# Patient Record
Sex: Female | Born: 1940 | Race: Asian | Hispanic: No | Marital: Married | Smoking: Never smoker
Health system: Southern US, Community
[De-identification: ages and names within clinical notes are randomized; demographics above are authoritative.]

## PROBLEM LIST (undated history)

## (undated) DIAGNOSIS — I10 Essential (primary) hypertension: Secondary | ICD-10-CM

## (undated) DIAGNOSIS — E119 Type 2 diabetes mellitus without complications: Secondary | ICD-10-CM

## (undated) DIAGNOSIS — E7800 Pure hypercholesterolemia, unspecified: Secondary | ICD-10-CM

## (undated) HISTORY — PX: FRACTURE SURGERY: SHX138

---

## 2000-08-07 ENCOUNTER — Encounter: Payer: Self-pay | Admitting: Emergency Medicine

## 2000-08-07 ENCOUNTER — Emergency Department (HOSPITAL_COMMUNITY): Admission: EM | Admit: 2000-08-07 | Discharge: 2000-08-07 | Payer: Self-pay | Admitting: Emergency Medicine

## 2000-09-03 ENCOUNTER — Encounter: Admission: RE | Admit: 2000-09-03 | Discharge: 2000-12-02 | Payer: Self-pay | Admitting: Internal Medicine

## 2000-10-30 ENCOUNTER — Encounter: Payer: Self-pay | Admitting: Internal Medicine

## 2000-10-30 ENCOUNTER — Encounter: Admission: RE | Admit: 2000-10-30 | Discharge: 2000-10-30 | Payer: Self-pay | Admitting: Internal Medicine

## 2000-11-30 ENCOUNTER — Encounter: Admission: RE | Admit: 2000-11-30 | Discharge: 2001-02-28 | Payer: Self-pay | Admitting: Internal Medicine

## 2000-12-03 ENCOUNTER — Encounter: Admission: RE | Admit: 2000-12-03 | Discharge: 2000-12-18 | Payer: Self-pay | Admitting: Internal Medicine

## 2000-12-28 ENCOUNTER — Ambulatory Visit (HOSPITAL_COMMUNITY): Admission: RE | Admit: 2000-12-28 | Discharge: 2000-12-28 | Payer: Self-pay | Admitting: *Deleted

## 2000-12-28 ENCOUNTER — Encounter: Payer: Self-pay | Admitting: *Deleted

## 2001-01-08 ENCOUNTER — Ambulatory Visit (HOSPITAL_COMMUNITY): Admission: RE | Admit: 2001-01-08 | Discharge: 2001-01-08 | Payer: Self-pay | Admitting: Orthopedic Surgery

## 2001-01-08 ENCOUNTER — Encounter: Payer: Self-pay | Admitting: Orthopedic Surgery

## 2001-01-14 ENCOUNTER — Encounter: Admission: RE | Admit: 2001-01-14 | Discharge: 2001-04-14 | Payer: Self-pay | Admitting: Orthopedic Surgery

## 2001-03-19 ENCOUNTER — Encounter: Admission: RE | Admit: 2001-03-19 | Discharge: 2001-04-27 | Payer: Self-pay | Admitting: Orthopedic Surgery

## 2001-05-05 ENCOUNTER — Ambulatory Visit (HOSPITAL_BASED_OUTPATIENT_CLINIC_OR_DEPARTMENT_OTHER): Admission: RE | Admit: 2001-05-05 | Discharge: 2001-05-06 | Payer: Self-pay | Admitting: Orthopedic Surgery

## 2001-05-19 ENCOUNTER — Encounter: Admission: RE | Admit: 2001-05-19 | Discharge: 2001-08-04 | Payer: Self-pay | Admitting: Orthopedic Surgery

## 2002-08-10 ENCOUNTER — Encounter: Payer: Self-pay | Admitting: Family Medicine

## 2002-08-10 ENCOUNTER — Ambulatory Visit (HOSPITAL_COMMUNITY): Admission: RE | Admit: 2002-08-10 | Discharge: 2002-08-10 | Payer: Self-pay | Admitting: Family Medicine

## 2003-11-15 ENCOUNTER — Other Ambulatory Visit: Admission: RE | Admit: 2003-11-15 | Discharge: 2003-11-15 | Payer: Self-pay | Admitting: Family Medicine

## 2003-12-30 ENCOUNTER — Ambulatory Visit (HOSPITAL_COMMUNITY): Admission: RE | Admit: 2003-12-30 | Discharge: 2003-12-30 | Payer: Self-pay | Admitting: Family Medicine

## 2004-05-31 ENCOUNTER — Emergency Department (HOSPITAL_COMMUNITY): Admission: EM | Admit: 2004-05-31 | Discharge: 2004-05-31 | Payer: Self-pay | Admitting: Emergency Medicine

## 2004-06-28 ENCOUNTER — Encounter: Admission: RE | Admit: 2004-06-28 | Discharge: 2004-09-26 | Payer: Self-pay | Admitting: Orthopedic Surgery

## 2004-08-29 ENCOUNTER — Emergency Department (HOSPITAL_COMMUNITY): Admission: EM | Admit: 2004-08-29 | Discharge: 2004-08-29 | Payer: Self-pay | Admitting: Emergency Medicine

## 2006-04-17 ENCOUNTER — Other Ambulatory Visit: Admission: RE | Admit: 2006-04-17 | Discharge: 2006-04-17 | Payer: Self-pay | Admitting: *Deleted

## 2006-04-28 ENCOUNTER — Ambulatory Visit (HOSPITAL_COMMUNITY): Admission: RE | Admit: 2006-04-28 | Discharge: 2006-04-28 | Payer: Self-pay | Admitting: Interventional Cardiology

## 2007-10-08 ENCOUNTER — Emergency Department (HOSPITAL_COMMUNITY): Admission: EM | Admit: 2007-10-08 | Discharge: 2007-10-08 | Payer: Self-pay | Admitting: Family Medicine

## 2007-11-15 ENCOUNTER — Ambulatory Visit: Payer: Self-pay | Admitting: *Deleted

## 2007-12-14 ENCOUNTER — Ambulatory Visit: Payer: Self-pay | Admitting: Family Medicine

## 2007-12-14 LAB — CONVERTED CEMR LAB: Microalb, Ur: 41.9 mg/dL — ABNORMAL HIGH (ref 0.00–1.89)

## 2008-01-11 ENCOUNTER — Emergency Department (HOSPITAL_COMMUNITY): Admission: EM | Admit: 2008-01-11 | Discharge: 2008-01-11 | Payer: Self-pay | Admitting: Emergency Medicine

## 2008-01-17 ENCOUNTER — Ambulatory Visit (HOSPITAL_COMMUNITY): Admission: RE | Admit: 2008-01-17 | Discharge: 2008-01-17 | Payer: Self-pay | Admitting: Family Medicine

## 2008-01-18 ENCOUNTER — Encounter (INDEPENDENT_AMBULATORY_CARE_PROVIDER_SITE_OTHER): Payer: Self-pay | Admitting: Family Medicine

## 2008-01-18 ENCOUNTER — Ambulatory Visit: Payer: Self-pay | Admitting: Internal Medicine

## 2008-01-18 LAB — CONVERTED CEMR LAB
ALT: 35 units/L (ref 0–35)
AST: 34 units/L (ref 0–37)
Albumin: 4.5 g/dL (ref 3.5–5.2)
Alkaline Phosphatase: 51 units/L (ref 39–117)
BUN: 10 mg/dL (ref 6–23)
Basophils Absolute: 0 10*3/uL (ref 0.0–0.1)
Basophils Relative: 1 % (ref 0–1)
CO2: 23 meq/L (ref 19–32)
Calcium: 9.6 mg/dL (ref 8.4–10.5)
Chloride: 105 meq/L (ref 96–112)
Cholesterol: 210 mg/dL — ABNORMAL HIGH (ref 0–200)
Creatinine, Ser: 0.6 mg/dL (ref 0.40–1.20)
Eosinophils Absolute: 0.1 10*3/uL (ref 0.0–0.7)
Eosinophils Relative: 2 % (ref 0–5)
Glucose, Bld: 94 mg/dL (ref 70–99)
HCT: 43.5 % (ref 36.0–46.0)
HDL: 59 mg/dL (ref >39)
Hemoglobin: 14.1 g/dL (ref 12.0–15.0)
LDL Cholesterol: 110 mg/dL — ABNORMAL HIGH (ref 0–99)
Lymphocytes Relative: 48 % — ABNORMAL HIGH (ref 12–46)
Lymphs Abs: 2.8 10*3/uL (ref 0.7–4.0)
MCHC: 32.4 g/dL (ref 30.0–36.0)
MCV: 92.2 fL (ref 78.0–100.0)
Monocytes Absolute: 0.5 10*3/uL (ref 0.1–1.0)
Monocytes Relative: 8 % (ref 3–12)
Neutro Abs: 2.5 10*3/uL (ref 1.7–7.7)
Neutrophils Relative %: 42 % — ABNORMAL LOW (ref 43–77)
Platelets: 267 10*3/uL (ref 150–400)
Potassium: 4.1 meq/L (ref 3.5–5.3)
RBC: 4.72 M/uL (ref 3.87–5.11)
RDW: 12.6 % (ref 11.5–15.5)
Sodium: 140 meq/L (ref 135–145)
TSH: 1.67 microintl units/mL (ref 0.350–4.50)
Total Bilirubin: 0.8 mg/dL (ref 0.3–1.2)
Total CHOL/HDL Ratio: 3.6
Total Protein: 7.4 g/dL (ref 6.0–8.3)
Triglycerides: 204 mg/dL — ABNORMAL HIGH (ref <150)
VLDL: 41 mg/dL — ABNORMAL HIGH (ref 0–40)
Vit D, 1,25-Dihydroxy: 19 — ABNORMAL LOW (ref 30–89)
WBC: 5.9 10*3/uL (ref 4.0–10.5)

## 2008-02-14 ENCOUNTER — Emergency Department (HOSPITAL_COMMUNITY): Admission: EM | Admit: 2008-02-14 | Discharge: 2008-02-14 | Payer: Self-pay | Admitting: Emergency Medicine

## 2008-02-14 ENCOUNTER — Ambulatory Visit: Payer: Self-pay | Admitting: Internal Medicine

## 2008-06-08 ENCOUNTER — Encounter (INDEPENDENT_AMBULATORY_CARE_PROVIDER_SITE_OTHER): Payer: Self-pay | Admitting: Family Medicine

## 2008-06-08 ENCOUNTER — Ambulatory Visit: Payer: Self-pay | Admitting: Family Medicine

## 2008-06-16 ENCOUNTER — Ambulatory Visit (HOSPITAL_COMMUNITY): Admission: RE | Admit: 2008-06-16 | Discharge: 2008-06-16 | Payer: Self-pay | Admitting: Family Medicine

## 2008-07-31 ENCOUNTER — Emergency Department (HOSPITAL_COMMUNITY): Admission: EM | Admit: 2008-07-31 | Discharge: 2008-08-01 | Payer: Self-pay | Admitting: Emergency Medicine

## 2008-08-09 ENCOUNTER — Encounter: Admission: RE | Admit: 2008-08-09 | Discharge: 2008-08-09 | Payer: Self-pay | Admitting: Family Medicine

## 2009-02-21 ENCOUNTER — Other Ambulatory Visit: Admission: RE | Admit: 2009-02-21 | Discharge: 2009-02-21 | Payer: Self-pay | Admitting: Family Medicine

## 2009-02-21 ENCOUNTER — Encounter: Admission: RE | Admit: 2009-02-21 | Discharge: 2009-02-21 | Payer: Self-pay | Admitting: Family Medicine

## 2009-07-22 IMAGING — CR DG CHEST 2V
2 series · 2 of 2 positions shown · non-contrast
Comparison: 05/31/2004

CLINICAL DATA: Neck and back pain

CHEST - 2 VIEW

[w chest pa]
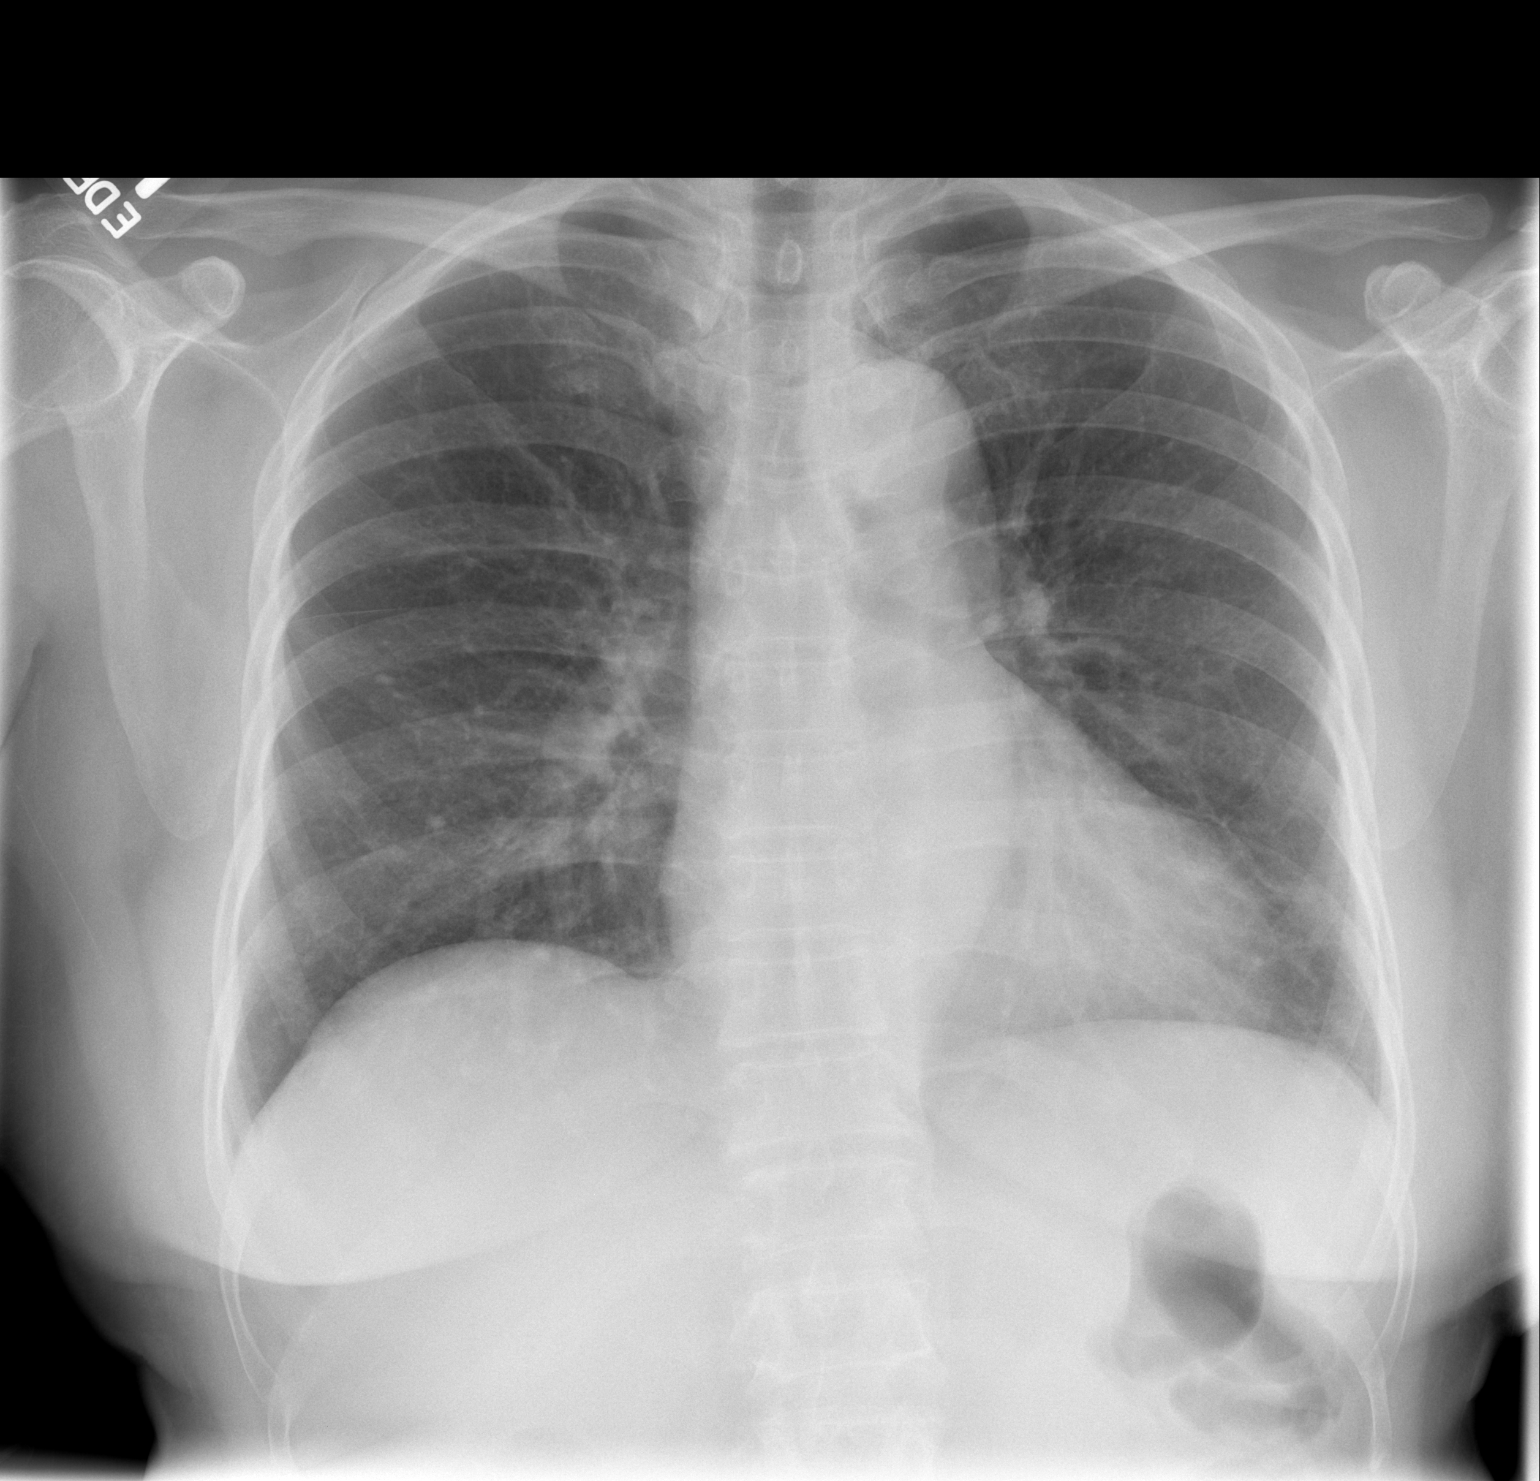

[w chest lat]
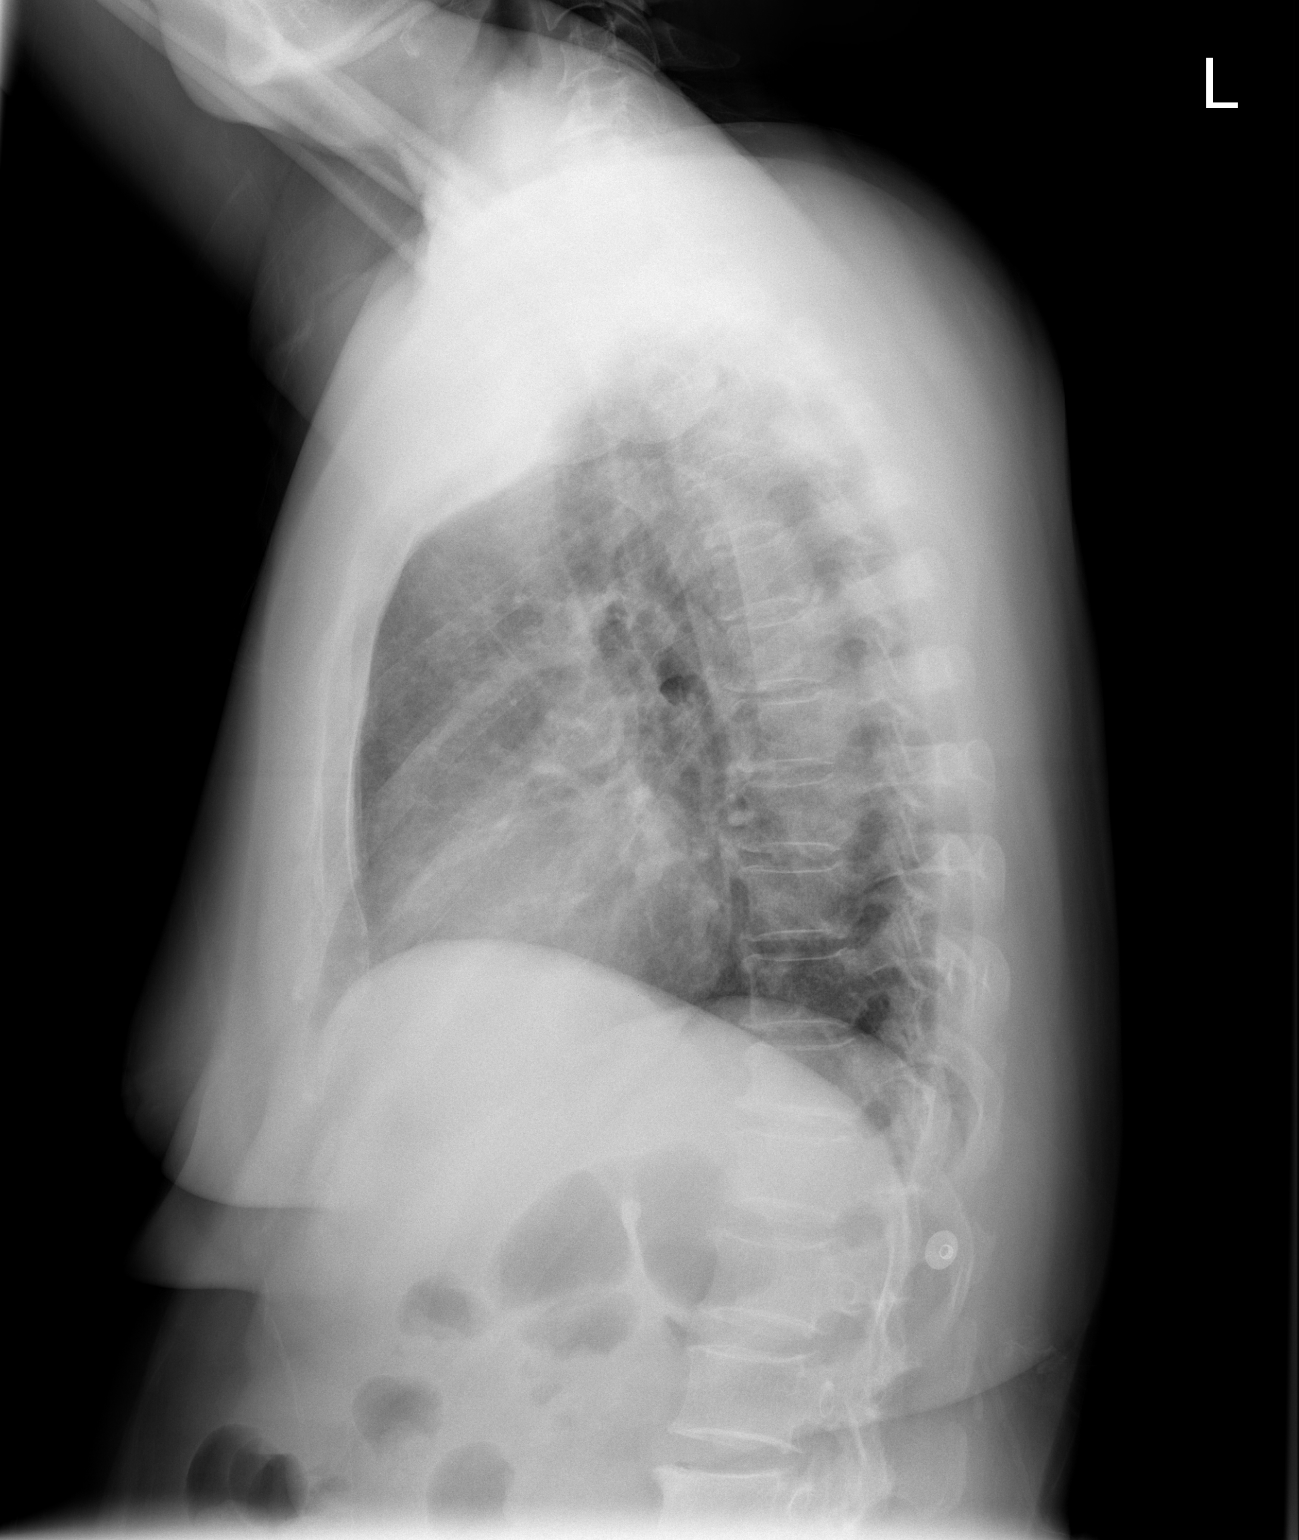

[2 of 2 positions shown; findings below may reference images not displayed]

FINDINGS: The cardiac silhouette, mediastinal and hilar contours
are within normal limits. There are chronic lung changes but no
acute  pulmonary findings.  No pleural effusions.  The bony thorax
is intact with stable mild compression deformity of T11.
IMPRESSION: 1.  Chronic lung changes.  No acute pulmonary findings.

## 2010-07-02 LAB — DIFFERENTIAL
Basophils Absolute: 0 10*3/uL (ref 0.0–0.1)
Basophils Relative: 0 % (ref 0–1)
Eosinophils Absolute: 0.1 10*3/uL (ref 0.0–0.7)
Eosinophils Relative: 1 % (ref 0–5)
Lymphocytes Relative: 29 % (ref 12–46)
Lymphs Abs: 2.5 10*3/uL (ref 0.7–4.0)
Monocytes Absolute: 1.1 10*3/uL — ABNORMAL HIGH (ref 0.1–1.0)
Monocytes Relative: 13 % — ABNORMAL HIGH (ref 3–12)
Neutro Abs: 4.9 10*3/uL (ref 1.7–7.7)
Neutrophils Relative %: 57 % (ref 43–77)

## 2010-07-02 LAB — BASIC METABOLIC PANEL
BUN: 13 mg/dL (ref 6–23)
CO2: 25 mEq/L (ref 19–32)
Calcium: 8.8 mg/dL (ref 8.4–10.5)
Chloride: 105 mEq/L (ref 96–112)
Creatinine, Ser: 0.7 mg/dL (ref 0.4–1.2)
GFR calc Af Amer: >60 mL/min (ref >60)
GFR calc non Af Amer: >60 mL/min (ref >60)
Glucose, Bld: 139 mg/dL — ABNORMAL HIGH (ref 70–99)
Potassium: 3.6 mEq/L (ref 3.5–5.1)
Sodium: 136 mEq/L (ref 135–145)

## 2010-07-02 LAB — CBC
HCT: 37 % (ref 36.0–46.0)
Hemoglobin: 12.9 g/dL (ref 12.0–15.0)
MCHC: 34.7 g/dL (ref 30.0–36.0)
MCV: 92.2 fL (ref 78.0–100.0)
Platelets: 217 10*3/uL (ref 150–400)
RBC: 4.01 MIL/uL (ref 3.87–5.11)
RDW: 12.4 % (ref 11.5–15.5)
WBC: 8.6 10*3/uL (ref 4.0–10.5)

## 2010-07-02 LAB — URINALYSIS, ROUTINE W REFLEX MICROSCOPIC
Bilirubin Urine: NEGATIVE
Glucose, UA: NEGATIVE mg/dL
Hgb urine dipstick: NEGATIVE
Protein, ur: NEGATIVE mg/dL
Urobilinogen, UA: 0.2 mg/dL (ref 0.0–1.0)

## 2010-08-09 NOTE — Op Note (Signed)
[place]  Patient:    Alexis Kaiser, Alexis Kaiser Visit Number: [id number] MRN: [id number]          Service Type: DSU Location: [facility] Attending Physician:  Georgena Spurling Dictated by:   Georgena Spurling, M.D. Proc. Date: 05/05/01 Admit Date:  05/05/2001                             Operative Report  SURGEON:  Georgena Spurling, M.D.  ASSISTANT:  Jamelle Rushing, P.A.  ANESTHESIA:  General plus interscalene block.  INDICATIONS FOR PROCEDURE:  The patient is 3 months status post injury and has failed conservative treatment.  MRI evidence of rotator cuff strain and slap tear.  Informed consent was obtained.  DESCRIPTION OF PROCEDURE:  The patient was laid supine and administered general anesthesia and placed in a beach chair position.  The left shoulder was prepped and draped in the usual sterile fashion.  Posterior portal was created with a #1 blade and 1 trocar and cannula.  It showed a frayed tear of the superior and anterior labrum.  The biceps was well anchored but at the 11 oclock position it had been avulsed away from the glenoid rim.  We made an anterior portal with an outside in technique and using an 11 blade, blunt trocar and cannula we then placed a clear plastic cannula in place and used a 3-5 Gator shaver to debride back the radial tears of the labrum.  We then denuded the bone in the area of the slap lesion and then used the arthroscopic bioabsorbable cork screw as suture, drilled and placed the suture at the 11 oclock position, then used a suture passing device to come from the outside of the labrum in, piercing through, grabbing one arm of the stitch and tying it down simply with a sliding stitch and a locking half hitch.  Then irrigated the shoulder and went into the subacromial space from posterior, created a straight lateral portal, formed a bursectomy and anterolateral acromioplasty with a 4.0 mm bur and released the CA ligament with a 90  degree Arthrocare wand.  Once the decompression was complete we evacuated the subacromial space with fluid and instruments, closed all 3 portals with interrupted 4-0 nylon sutures, dressed with Adaptic, 4 x 4s, ABDs, and sterile soaked taped.  Placed the shoulder in a sling and swath dressing.  The patient tolerated the procedure well.  COMPLICATIONS:  None.  DRAINS:  None.  ESTIMATED BLOOD LOSS:  Minimal. Dictated by:   Georgena Spurling, M.D. Attending Physician:  Georgena Spurling DD:  05/05/01 TD:  05/05/01 Job: [id number]

## 2011-07-28 ENCOUNTER — Other Ambulatory Visit: Payer: Self-pay | Admitting: Rehabilitation

## 2011-07-28 DIAGNOSIS — M545 Low back pain: Secondary | ICD-10-CM

## 2011-07-29 ENCOUNTER — Ambulatory Visit
Admission: RE | Admit: 2011-07-29 | Discharge: 2011-07-29 | Disposition: A | Discharge status: Home / Self Care | Payer: Medicare Other | Source: Ambulatory Visit | Attending: Rehabilitation | Admitting: Rehabilitation

## 2011-07-29 DIAGNOSIS — M545 Low back pain: Secondary | ICD-10-CM

## 2012-06-17 ENCOUNTER — Encounter (HOSPITAL_COMMUNITY): Payer: Self-pay | Admitting: Family Medicine

## 2012-06-17 ENCOUNTER — Inpatient Hospital Stay (HOSPITAL_COMMUNITY)
Admission: EM | Admit: 2012-06-17 | Discharge: 2012-06-19 | DRG: 287 | Disposition: A | Discharge status: Home / Self Care | Payer: Medicare Other | Attending: Internal Medicine | Admitting: Internal Medicine

## 2012-06-17 ENCOUNTER — Emergency Department (HOSPITAL_COMMUNITY): Payer: Medicare Other

## 2012-06-17 DIAGNOSIS — I10 Essential (primary) hypertension: Secondary | ICD-10-CM | POA: Diagnosis present

## 2012-06-17 DIAGNOSIS — E785 Hyperlipidemia, unspecified: Secondary | ICD-10-CM | POA: Diagnosis present

## 2012-06-17 DIAGNOSIS — R0789 Other chest pain: Principal | ICD-10-CM | POA: Diagnosis present

## 2012-06-17 DIAGNOSIS — Z9010 Acquired absence of unspecified breast and nipple: Secondary | ICD-10-CM

## 2012-06-17 DIAGNOSIS — Z853 Personal history of malignant neoplasm of breast: Secondary | ICD-10-CM

## 2012-06-17 DIAGNOSIS — Z8249 Family history of ischemic heart disease and other diseases of the circulatory system: Secondary | ICD-10-CM

## 2012-06-17 DIAGNOSIS — K219 Gastro-esophageal reflux disease without esophagitis: Secondary | ICD-10-CM | POA: Diagnosis present

## 2012-06-17 DIAGNOSIS — Z7982 Long term (current) use of aspirin: Secondary | ICD-10-CM

## 2012-06-17 DIAGNOSIS — R079 Chest pain, unspecified: Secondary | ICD-10-CM | POA: Diagnosis present

## 2012-06-17 DIAGNOSIS — E119 Type 2 diabetes mellitus without complications: Secondary | ICD-10-CM | POA: Diagnosis present

## 2012-06-17 DIAGNOSIS — I200 Unstable angina: Secondary | ICD-10-CM

## 2012-06-17 DIAGNOSIS — Z923 Personal history of irradiation: Secondary | ICD-10-CM

## 2012-06-17 DIAGNOSIS — Z9221 Personal history of antineoplastic chemotherapy: Secondary | ICD-10-CM

## 2012-06-17 DIAGNOSIS — Z79899 Other long term (current) drug therapy: Secondary | ICD-10-CM

## 2012-06-17 DIAGNOSIS — E7800 Pure hypercholesterolemia, unspecified: Secondary | ICD-10-CM | POA: Diagnosis present

## 2012-06-17 DIAGNOSIS — Z833 Family history of diabetes mellitus: Secondary | ICD-10-CM

## 2012-06-17 HISTORY — DX: Essential (primary) hypertension: I10

## 2012-06-17 HISTORY — DX: Pure hypercholesterolemia, unspecified: E78.00

## 2012-06-17 HISTORY — DX: Type 2 diabetes mellitus without complications: E11.9

## 2012-06-17 LAB — BASIC METABOLIC PANEL
BUN: 17 mg/dL (ref 6–23)
CO2: 27 mEq/L (ref 19–32)
Calcium: 10.1 mg/dL (ref 8.4–10.5)
Creatinine, Ser: 0.76 mg/dL (ref 0.50–1.10)
GFR calc non Af Amer: 84 mL/min — ABNORMAL LOW (ref >90)
Glucose, Bld: 94 mg/dL (ref 70–99)
Sodium: 138 mEq/L (ref 135–145)

## 2012-06-17 LAB — CBC WITH DIFFERENTIAL/PLATELET
Eosinophils Absolute: 0.2 10*3/uL (ref 0.0–0.7)
Eosinophils Relative: 2 % (ref 0–5)
HCT: 37.6 % (ref 36.0–46.0)
Lymphs Abs: 3.1 10*3/uL (ref 0.7–4.0)
MCH: 31 pg (ref 26.0–34.0)
MCV: 90.4 fL (ref 78.0–100.0)
Monocytes Absolute: 0.7 10*3/uL (ref 0.1–1.0)
Platelets: 209 10*3/uL (ref 150–400)
RBC: 4.16 MIL/uL (ref 3.87–5.11)
RDW: 12.7 % (ref 11.5–15.5)

## 2012-06-17 LAB — PROTIME-INR
INR: 0.99 (ref 0.00–1.49)
Prothrombin Time: 13 seconds (ref 11.6–15.2)

## 2012-06-17 LAB — URINALYSIS, ROUTINE W REFLEX MICROSCOPIC
Bilirubin Urine: NEGATIVE
Glucose, UA: NEGATIVE mg/dL
Hgb urine dipstick: NEGATIVE
Ketones, ur: NEGATIVE mg/dL
Protein, ur: NEGATIVE mg/dL
Urobilinogen, UA: 0.2 mg/dL (ref 0.0–1.0)

## 2012-06-17 LAB — MRSA PCR SCREENING: MRSA by PCR: NEGATIVE

## 2012-06-17 LAB — GLUCOSE, CAPILLARY: Glucose-Capillary: 129 mg/dL — ABNORMAL HIGH (ref 70–99)

## 2012-06-17 LAB — URINE MICROSCOPIC-ADD ON

## 2012-06-17 MED ORDER — ASPIRIN EC 81 MG PO TBEC
81.0000 mg | DELAYED_RELEASE_TABLET | Freq: Every day | ORAL | Status: DC
  Administered 2012-06-18: 81 mg via ORAL
  Filled 2012-06-17 (×2): qty 1

## 2012-06-17 MED ORDER — ACETAMINOPHEN 325 MG PO TABS
650.0000 mg | ORAL_TABLET | Freq: Four times a day (QID) | ORAL | Status: DC | PRN
  Administered 2012-06-18: 650 mg via ORAL
  Filled 2012-06-17: qty 2

## 2012-06-17 MED ORDER — ONDANSETRON HCL 4 MG PO TABS: 4.0000 mg | ORAL_TABLET | Freq: Four times a day (QID) | ORAL | Status: DC | PRN

## 2012-06-17 MED ORDER — SIMVASTATIN 40 MG PO TABS
40.0000 mg | ORAL_TABLET | Freq: Every evening | ORAL | Status: DC
  Filled 2012-06-17 (×4): qty 1

## 2012-06-17 MED ORDER — PANTOPRAZOLE SODIUM 40 MG PO TBEC
40.0000 mg | DELAYED_RELEASE_TABLET | Freq: Every day | ORAL | Status: DC
  Administered 2012-06-18 – 2012-06-19 (×2): 40 mg via ORAL
  Filled 2012-06-17 (×2): qty 1

## 2012-06-17 MED ORDER — NITROGLYCERIN IN D5W 200-5 MCG/ML-% IV SOLN
5.0000 ug/min | INTRAVENOUS | Status: DC
  Administered 2012-06-17 – 2012-06-18 (×2): 5 ug/min via INTRAVENOUS
  Filled 2012-06-17: qty 250

## 2012-06-17 MED ORDER — ONDANSETRON HCL 4 MG/2ML IJ SOLN: 4.0000 mg | Freq: Three times a day (TID) | INTRAMUSCULAR | Status: DC | PRN

## 2012-06-17 MED ORDER — SODIUM CHLORIDE 0.9 % IV SOLN
INTRAVENOUS | Status: DC
  Administered 2012-06-17: 20:00:00 via INTRAVENOUS

## 2012-06-17 MED ORDER — SODIUM CHLORIDE 0.9 % IV SOLN: 250.0000 mL | INTRAVENOUS | Status: DC | PRN

## 2012-06-17 MED ORDER — METOPROLOL TARTRATE 12.5 MG HALF TABLET
12.5000 mg | ORAL_TABLET | Freq: Two times a day (BID) | ORAL | Status: DC
  Administered 2012-06-17 – 2012-06-18 (×2): 12.5 mg via ORAL
  Filled 2012-06-17 (×6): qty 1

## 2012-06-17 MED ORDER — ACETAMINOPHEN 650 MG RE SUPP: 650.0000 mg | Freq: Four times a day (QID) | RECTAL | Status: DC | PRN

## 2012-06-17 MED ORDER — HEPARIN (PORCINE) IN NACL 100-0.45 UNIT/ML-% IJ SOLN
1100.0000 [IU]/h | INTRAMUSCULAR | Status: DC
  Administered 2012-06-17: 800 [IU]/h via INTRAVENOUS
  Filled 2012-06-17 (×2): qty 250

## 2012-06-17 MED ORDER — SODIUM CHLORIDE 0.9 % IJ SOLN: 3.0000 mL | INTRAMUSCULAR | Status: DC | PRN

## 2012-06-17 MED ORDER — ONDANSETRON HCL 4 MG/2ML IJ SOLN
4.0000 mg | Freq: Four times a day (QID) | INTRAMUSCULAR | Status: DC | PRN
  Administered 2012-06-18: 4 mg via INTRAVENOUS
  Filled 2012-06-17: qty 2

## 2012-06-17 MED ORDER — IRBESARTAN 75 MG PO TABS
75.0000 mg | ORAL_TABLET | Freq: Every day | ORAL | Status: DC
  Administered 2012-06-18: 75 mg via ORAL
  Filled 2012-06-17 (×3): qty 1

## 2012-06-17 MED ORDER — ASPIRIN 81 MG PO CHEW
324.0000 mg | CHEWABLE_TABLET | ORAL | Status: AC
  Administered 2012-06-18: 324 mg via ORAL
  Filled 2012-06-17: qty 4

## 2012-06-17 MED ORDER — MORPHINE SULFATE 4 MG/ML IJ SOLN
4.0000 mg | Freq: Once | INTRAMUSCULAR | Status: AC
  Administered 2012-06-17: 4 mg via INTRAVENOUS
  Filled 2012-06-17: qty 1

## 2012-06-17 MED ORDER — HEPARIN BOLUS VIA INFUSION
3500.0000 [IU] | Freq: Once | INTRAVENOUS | Status: AC
  Administered 2012-06-17: 3500 [IU] via INTRAVENOUS

## 2012-06-17 MED ORDER — SODIUM CHLORIDE 0.9 % IJ SOLN
3.0000 mL | Freq: Two times a day (BID) | INTRAMUSCULAR | Status: DC
  Administered 2012-06-17 – 2012-06-18 (×2): 3 mL via INTRAVENOUS

## 2012-06-17 MED ORDER — NITROGLYCERIN 0.4 MG SL SUBL
0.4000 mg | SUBLINGUAL_TABLET | SUBLINGUAL | Status: DC | PRN
  Administered 2012-06-17: 0.4 mg via SUBLINGUAL

## 2012-06-17 MED ORDER — GABAPENTIN 300 MG PO CAPS
600.0000 mg | ORAL_CAPSULE | Freq: Every day | ORAL | Status: DC
  Administered 2012-06-18: 22:00:00 600 mg via ORAL
  Filled 2012-06-17 (×4): qty 2

## 2012-06-17 MED ORDER — MORPHINE SULFATE 2 MG/ML IJ SOLN
1.0000 mg | INTRAMUSCULAR | Status: DC | PRN
  Administered 2012-06-18 (×2): 1 mg via INTRAVENOUS
  Filled 2012-06-17 (×2): qty 1

## 2012-06-17 MED ORDER — INSULIN ASPART 100 UNIT/ML ~~LOC~~ SOLN: 0.0000 [IU] | Freq: Three times a day (TID) | SUBCUTANEOUS | Status: DC

## 2012-06-17 MED ORDER — SODIUM CHLORIDE 0.9 % IV SOLN
1.0000 mL/kg/h | INTRAVENOUS | Status: DC
  Administered 2012-06-18: 1 mL/kg/h via INTRAVENOUS

## 2012-06-17 NOTE — Consult Note (Signed)
Reason for Consult: Chest pain Referring Physician:   [name] is an 72 y.o. female.  HPI:   The patient is a 73 yo female who does not speak English but is accompanied by her daughter.  She has a history of DM, HTN and HLD.  She developed chest pressure at 2200hrs last night which she described as severe.  It was associated with nausea and tingling in her arms.  She denies vomiting, fever, LEE, ABD pain, hematuria, hematochezia.  The meds given in the ER have helped.    Past Medical History  Diagnosis Date  . Hypertension   . Diabetes mellitus without complication   . High cholesterol     History reviewed. No pertinent past surgical history.  History reviewed. No pertinent family history.  Social History:  reports that she has never smoked. She does not have any smokeless tobacco history on file. She reports that she does not drink alcohol. Her drug history is not on file.  Allergies: No Known Allergies  Medications:  Prior to Admission medications   Medication Sig Start Date End Date Taking? Authorizing Provider  aspirin 81 MG tablet Take 81 mg by mouth daily.   Yes Historical Provider, MD  Cholecalciferol 4000 UNITS CAPS Take 4,000 Units by mouth daily.   Yes Historical Provider, MD  gabapentin (NEURONTIN) 300 MG capsule Take 300-600 mg by mouth daily.   Yes Historical Provider, MD  metFORMIN (GLUMETZA) 500 MG (MOD) 24 hr tablet Take 500 mg by mouth 2 (two) times daily with a meal.   Yes Historical Provider, MD  simvastatin (ZOCOR) 40 MG tablet Take 40 mg by mouth every evening.   Yes Historical Provider, MD  valsartan (DIOVAN) 160 MG tablet Take 160 mg by mouth daily.   Yes Historical Provider, MD     Results for orders placed during the hospital encounter of 06/17/12 (from the past 48 hour(s))  CBC WITH DIFFERENTIAL     Status: None   Collection Time    06/17/12  2:50 PM      Result Value Range   WBC 7.5  4.0 - 10.5 K/uL   RBC 4.16  3.87 - 5.11 MIL/uL   Hemoglobin  12.9  12.0 - 15.0 g/dL   HCT 04.5  40.9 - 81.1 %   MCV 90.4  78.0 - 100.0 fL   MCH 31.0  26.0 - 34.0 pg   MCHC 34.3  30.0 - 36.0 g/dL   RDW 91.4  78.2 - 95.6 %   Platelets 209  150 - 400 K/uL   Neutrophils Relative 48  43 - 77 %   Neutro Abs 3.6  1.7 - 7.7 K/uL   Lymphocytes Relative 41  12 - 46 %   Lymphs Abs 3.1  0.7 - 4.0 K/uL   Monocytes Relative 9  3 - 12 %   Monocytes Absolute 0.7  0.1 - 1.0 K/uL   Eosinophils Relative 2  0 - 5 %   Eosinophils Absolute 0.2  0.0 - 0.7 K/uL   Basophils Relative 1  0 - 1 %   Basophils Absolute 0.0  0.0 - 0.1 K/uL  BASIC METABOLIC PANEL     Status: Abnormal   Collection Time    06/17/12  2:50 PM      Result Value Range   Sodium 138  135 - 145 mEq/L   Potassium 4.3  3.5 - 5.1 mEq/L   Chloride 102  96 - 112 mEq/L   CO2 27  19 - 32 mEq/L   Glucose, Bld 94  70 - 99 mg/dL   BUN 17  6 - 23 mg/dL   Creatinine, Ser 0.45  0.50 - 1.10 mg/dL   Calcium 40.9  8.4 - 81.1 mg/dL   GFR calc non Af Amer 84 (*) >90 mL/min   GFR calc Af Amer >90  >90 mL/min   Comment:            The eGFR has been calculated     using the CKD EPI equation.     This calculation has not been     validated in all clinical     situations.     eGFR's persistently     <90 mL/min signify     possible Chronic Kidney Disease.  POCT I-STAT TROPONIN I     Status: None   Collection Time    06/17/12  3:03 PM      Result Value Range   Troponin i, poc 0.01  0.00 - 0.08 ng/mL   Comment 3            Comment: Due to the release kinetics of cTnI,     a negative result within the first hours     of the onset of symptoms does not rule out     myocardial infarction with certainty.     If myocardial infarction is still suspected,     repeat the test at appropriate intervals.    Dg Chest Port 1 View  06/17/2012  *RADIOLOGY REPORT*  Clinical Data: Chest pain.  PORTABLE CHEST - 1 VIEW  Comparison: PA and lateral chest 02/14/2008.  Findings: Lungs are clear.  Heart size normal.  No  pneumothorax or pleural effusion.  IMPRESSION: Negative chest.   Original Report Authenticated By: Holley Dexter, M.D.     Review of Systems  Constitutional: Negative for fever and diaphoresis.  Respiratory: Positive for shortness of breath. Negative for cough.   Cardiovascular: Positive for chest pain. Negative for leg swelling.  Gastrointestinal: Positive for nausea. Negative for vomiting, abdominal pain and blood in stool.  Genitourinary: Negative for dysuria and hematuria.  Neurological: Positive for tingling (She reports tingling in her arms).   Blood pressure 112/72, pulse 61, temperature 98.3 F (36.8 C), temperature source Oral, resp. rate 18, height 4\' 11"  (1.499 m), weight 58.968 kg (130 lb), SpO2 96.00%. Physical Exam  Constitutional: She is oriented to person, place, and time. She appears well-developed. No distress.  Overweight  HENT:  Head: Normocephalic and atraumatic.  Mouth/Throat: Oropharynx is clear and moist. No oropharyngeal exudate.  Eyes: EOM are normal. Pupils are equal, round, and reactive to light. No scleral icterus.  Neck: Normal range of motion. No JVD present.  Cardiovascular: Normal rate, regular rhythm, S1 normal and S2 normal.   No murmur heard. Pulses:      Radial pulses are 2+ on the right side, and 2+ on the left side.       Dorsalis pedis pulses are 2+ on the right side, and 2+ on the left side.  No Carotid Bruits  Respiratory: Effort normal and breath sounds normal. No respiratory distress. She has no wheezes. She has no rales.  GI: Soft. Bowel sounds are normal. She exhibits no distension. There is no tenderness.  Musculoskeletal: She exhibits no edema.  Lymphadenopathy:    She has no cervical adenopathy.  Neurological: She is alert and oriented to person, place, and time. She exhibits normal muscle tone.  Skin: Skin is  warm and dry.  Psychiatric: She has a normal mood and affect.    Assessment/Plan: Principal Problem:   Chest  pain Active Problems:   HTN (hypertension)   HLD (hyperlipidemia)   Diabetes mellitus type 2 in nonobese   GERD (gastroesophageal reflux disease)  Plan:  The patient's presentation is concerning for unstable angina.  EKG shows 1st degree AVB but no acute changes.  Continue IV heparin and IV nitro.  Titrate as needed.  Will add lopressor 12.5mg  BID.  Check lipids in AM.  TSH and A1C.  Coronary angiogram may be the appropriate choice.     Alexis Kaiser, Alexis Kaiser 06/17/2012, 5:22 PM    Patient seen and examined. Agree with assessment and plan. Very pleasant 72 yo [place] female with history of DM, HTN, hyperlipidemia who presents after experiencing several episodes of substernal chest pressure.Last evening, she had her most severe episode associated with diaphoresis, nausea and dyspnea. Her pain has improved with NTG and heparin. ECG with subtle T changes. Symptom complex is worrisome for unstable angina. Continue with heparin, NTG add beta-blocker and plan definitive cardiac catheterization tomorrow. Discussed with daughter who is with patient who understands risks/benefits with potential PCI if indicated.   Alexis Bihari, MD, [facility] 06/17/2012 5:40 PM

## 2012-06-17 NOTE — Progress Notes (Signed)
ANTICOAGULATION CONSULT NOTE - Follow Up Consult  Pharmacy Consult for heparin Indication: chest pain/ACS  No Known Allergies  Patient Measurements: Height: 5\' 2"  (157.5 cm) Weight: 126 lb 12.2 oz (57.5 kg) IBW/kg (Calculated) : 50.1 Heparin Dosing Weight: 59  Vital Signs: Temp: 97.7 F (36.5 C) (03/27 2339) Temp src: Oral (03/27 2339) BP: 109/63 mmHg (03/27 2339) Pulse Rate: 74 (03/27 2339)  Labs:  Recent Labs  06/17/12 1450 06/17/12 2016 06/17/12 2236  HGB 12.9  --   --   HCT 37.6  --   --   PLT 209  --   --   LABPROT  --   --  13.0  INR  --   --  0.99  HEPARINUNFRC  --   --  0.17*  CREATININE 0.76  --   --   TROPONINI  --  <0.30  --     Estimated Creatinine Clearance: 52.5 ml/min (by C-G formula based on Cr of 0.76).   Medical History: Past Medical History  Diagnosis Date  . Hypertension   . Diabetes mellitus without complication   . High cholesterol     Medications:  Prescriptions prior to admission  Medication Sig Dispense Refill  . aspirin 81 MG tablet Take 81 mg by mouth daily.      . Cholecalciferol 4000 UNITS CAPS Take 4,000 Units by mouth daily.      Marland Kitchen gabapentin (NEURONTIN) 300 MG capsule Take 600 mg by mouth at bedtime.      . metFORMIN (GLUMETZA) 500 MG (MOD) 24 hr tablet Take 500 mg by mouth 2 (two) times daily with a meal.      . simvastatin (ZOCOR) 40 MG tablet Take 40 mg by mouth every evening.      . valsartan (DIOVAN) 160 MG tablet Take 160 mg by mouth daily.      Marland Kitchen gabapentin (NEURONTIN) 300 MG capsule Take 300-600 mg by mouth daily.       Scheduled:  . [START ON 06/18/2012] aspirin  324 mg Oral Pre-Cath  . [START ON 06/18/2012] aspirin EC  81 mg Oral Daily  . gabapentin  600 mg Oral QHS  . [COMPLETED] heparin  3,500 Units Intravenous Once  . [START ON 06/18/2012] insulin aspart  0-9 Units Subcutaneous TID WC  . irbesartan  75 mg Oral Daily  . metoprolol tartrate  12.5 mg Oral BID  . [COMPLETED]  morphine injection  4 mg Intravenous  Once  . [START ON 06/18/2012] pantoprazole  40 mg Oral Q breakfast  . simvastatin  40 mg Oral QPM  . sodium chloride  3 mL Intravenous Q12H    Assessment: 72 yo who presented with [place] at the outpt visit. She was tx to ED to r/o MI. Initial heparin level 0.17 units/ml  Goal of Therapy:  Heparin level 0.3-0.7 units/ml Monitor platelets by anticoagulation protocol: Yes   Plan:  Increase heparin drip to 950 units/hr Check heparin level in 6 hours  Shameeka Silliman [place] 06/17/2012,11:58 PM

## 2012-06-17 NOTE — Progress Notes (Signed)
ANTICOAGULATION CONSULT NOTE - Initial Consult  Pharmacy Consult for heparin Indication: chest pain/ACS  No Known Allergies  Patient Measurements: Height: 4\' 11"  (149.9 cm) Weight: 130 lb (58.968 kg) IBW/kg (Calculated) : 43.2 Heparin Dosing Weight: 59  Vital Signs: Temp: 98.3 F (36.8 C) (03/27 1329) Temp src: Oral (03/27 1329) BP: 107/70 mmHg (03/27 1445) Pulse Rate: 61 (03/27 1445)  Labs: No results found for this basename: HGB, HCT, PLT, APTT, LABPROT, INR, HEPARINUNFRC, CREATININE, CKTOTAL, CKMB, TROPONINI,  in the last 72 hours  Estimated Creatinine Clearance: 51.9 ml/min (by C-G formula based on Cr of 0.7).   Medical History: Past Medical History  Diagnosis Date  . Hypertension   . Diabetes mellitus without complication   . High cholesterol     Medications:   (Not in a hospital admission) Scheduled:  .  morphine injection  4 mg Intravenous Once    Assessment: 72 yo who presented with [place] at the outpt visit. She was tx to ED to r/o MI. IV heparin has been ordered for anticoag.  Goal of Therapy:  Heparin level 0.3-0.7 units/ml Monitor platelets by anticoagulation protocol: Yes   Plan:   Heparin bolus 3500 units/hr Heparin drip at 800 units/hr F/u with 6hr heparin level  Ulyses Southward [place] 06/17/2012,3:18 PM

## 2012-06-17 NOTE — ED Notes (Signed)
Pt sent here from [facility] for unstable angina. sts chest heaviness that started last night. Relief with nitro.324 ASA given at office. Chest pain associated with SOB, nausea, vomiting and fatigue.

## 2012-06-17 NOTE — H&P (Signed)
[facility] History and Physical  Alexis CAVENAUGH WUJ:[id number] DOB: 1940-04-20 DOA: 06/17/2012  Referring physician: Shea Stakes PCP: Dr. Merri Brunette  Chief Complaint: Chest pain.  HPI: Alexis Kaiser is a 71 y.o. female with a past medical history significant for hypertension, diabetes, hyperlipidemia, neuropathy and gastroesophageal reflux disease; came to the hospital secondary to chest pain. Patient only speaks [place] and translation during this encounter was provided by her daughter who speak good [place]. They reports chest pain localize on the left side, no radiation, pressure-like, associated shortness of breath, nausea, and sensation that she almost passed out due to the pain. They reports that her pain started just today night and at that moment patient saw that the pain will go away and decline medical attention after about an hour patient subsided and she was able to go to bed at the moment that she will called this morning the discomfort for her left-sided chest returns again. She endorses that she was not doing any x-ray showed when the pain presented. In the ED a chest x-ray demonstrated no acute or pulmonary process. EKG shows some flattening T waves changes otherwise no acute ischemic abnormalities; troponins negative x1. The rest of her blood work was unremarkable. Patient's pain was only relieved by the use of nitroglycerin. She was started on heparin drip and nitroglycerin drip do to her ongoing discomfort and [facility] has been called to admit the patient for further evaluation and treatment along with cardiology consultation.   Review of Systems:  Negative except as otherwise mentioned on history of present illness.  Past Medical History  Diagnosis Date  . Hypertension   . Diabetes mellitus without complication   . High cholesterol    History reviewed. No pertinent past surgical history.  Social History:  reports that she has never smoked. She does  not have any smokeless tobacco history on file. She reports that she does not drink alcohol. Her drug history is not on file. Lives at home and did not require any assistance with activities of daily living. No Known Allergies  Family history: Significant for hypertension and diabetes otherwise irrelevant according to patient.  Prior to Admission medications   Medication Sig Start Date End Date Taking? Authorizing Provider  aspirin 81 MG tablet Take 81 mg by mouth daily.   Yes Historical Provider, MD  Cholecalciferol 4000 UNITS CAPS Take 4,000 Units by mouth daily.   Yes Historical Provider, MD  gabapentin (NEURONTIN) 300 MG capsule Take 300-600 mg by mouth daily.   Yes Historical Provider, MD  metFORMIN (GLUMETZA) 500 MG (MOD) 24 hr tablet Take 500 mg by mouth 2 (two) times daily with a meal.   Yes Historical Provider, MD  simvastatin (ZOCOR) 40 MG tablet Take 40 mg by mouth every evening.   Yes Historical Provider, MD  valsartan (DIOVAN) 160 MG tablet Take 160 mg by mouth daily.   Yes Historical Provider, MD   Physical Exam: Filed Vitals:   06/17/12 1445 06/17/12 1500 06/17/12 1530 06/17/12 1609  BP: 107/70  120/77 112/72  Pulse: 61  61 61  Temp:      TempSrc:      Resp: 15  17 18   Height:  4\' 11"  (1.499 m)    Weight:  58.968 kg (130 lb)    SpO2: 99%  100% 96%     General:  Mild distress secondary to ongoing chest discomfort. Able to speak in full sentences in not overt shortness of breath appreciated on exam  Eyes:  PERRLA, extraocular muscles intact, no icterus, no nystagmus  ENT:  Moist mucous membranes, good dentition, no erythema or exudates inside her mouth. No discharges out of her ears or nostrils.  Neck: Supple, no thyromegaly, no bruits, no JVD  Cardiovascular: S1 and S2 appreciated, sinus rhythm, regular rate, no murmurs, no gallops or rubs.  Respiratory: Clear to auscultation bilaterally.  Abdomen: Soft, nontender, nondistended, positive bowel sounds  Skin: No  rash, no petechiae  Musculoskeletal: Full range of motion. No joint swelling or erythema appreciated.  Psychiatric: Stable and appropriate for situation.  Neurologic: Alert, awake and oriented x3, no focal motor or sensory deficit appreciated on exam, cranial nerves grossly intact, able to follow commands appropriately and answering  Questions through interpreter.  Labs on Admission:  Basic Metabolic Panel:  Recent Labs Lab 06/17/12 1450  NA 138  K 4.3  CL 102  CO2 27  GLUCOSE 94  BUN 17  CREATININE 0.76  CALCIUM 10.1   CBC:  Recent Labs Lab 06/17/12 1450  WBC 7.5  NEUTROABS 3.6  HGB 12.9  HCT 37.6  MCV 90.4  PLT 209   Radiological Exams on Admission: Dg Chest Port 1 View  06/17/2012  *RADIOLOGY REPORT*  Clinical Data: Chest pain.  PORTABLE CHEST - 1 VIEW  Comparison: PA and lateral chest 02/14/2008.  Findings: Lungs are clear.  Heart size normal.  No pneumothorax or pleural effusion.  IMPRESSION: Negative chest.   Original Report Authenticated By: Holley Dexter, M.D.     EKG:  Rate: 66  Rhythm: normal sinus rhythm  QRS Axis: normal  Intervals: normal  ST/T Wave abnormalities: normal  Conduction Disutrbances:none  Narrative Interpretation:  Old EKG Reviewed: changes noted - T WAVE FLATTENING   Assessment/Plan 1-Chest pain: Typical presentation. Significant risk factors including: Hypertension, diabetes mellitus, age, hyperlipidemia. Pain only relieve by nitroglycerin. Also with some flattening T wave changes; no other acute ischemic changes or old EKG to compare. -Will admit to a step down in order to continue heparin drip and also nitroglycerin drip -Continue aspirin, statins, ARB -Will check 2-D echo, cycle cardiac enzymes and EKGs. -Cardiology has been consulted and will follow recommendations. -started on PPI  2-HTN (hypertension): Will continue antihypertensive regimen Avapro (75 mg by mouth daily. Patient also receiving nitroglycerin drip we will  keep her blood pressure under control  3-HLD (hyperlipidemia): Continue statins. Check lipid panel  4-Diabetes mellitus type 2 in nonobese: Appears to be well control according to family members. They also endorses good compliance. Will check hemoglobin A1c. Will hold metformin while inpatient and will use a sliding scale insulin.  5-GERD (gastroesophageal reflux disease): Started on PPI.  DVT: On heparin drip.    Cardiology has been consulted.  Code Status: Full Family Communication: daughter at bedside Disposition Plan: admitted to step down inpatient for further evaluation and treatment due to CP/ACS; LOS > 2 midnights  Time spent: >30 minutes  Dreyah Montrose [facility] Pager [phone]  If 7PM-7AM, please contact night-coverage [email or web address] Password [facility] 06/17/2012, 4:54 PM

## 2012-06-17 NOTE — ED Provider Notes (Signed)
History     CSN: [id number]  Arrival date & time 06/17/12  1316   First MD Initiated Contact with Patient 06/17/12 1340      Chief Complaint  Patient presents with  . Chest Pain    (Consider location/radiation/quality/duration/timing/severity/associated sxs/prior treatment) HPI Comments: Pt is a 72 y/o female who presents with chief complaint of left sided weakness. She states that she woke up yesterday with severe left sided weakness and a sense of heaviness and numbness in the left side of her entire body. The weakness and numbness have persisted until now. Pt says that she has had TIA's in the past when she underwent intracranial shunt placement for an aneurysm. Says that this occurred many years ago and has not had any strokes until now. She underwent treatment for breast cancer last year, including bilateral mastectomy, chemo, and radiation. Pt denies dizziness, nausea, vomiting, loss of consciousness, headache. Her sister is present and denies any seizure like activity.  PATIENT SPEAKS [place], DAUGHTER TRANSLATED.  Patient is a 72 y.o. female presenting with chest pain. The history is provided by the patient.  Chest Pain Associated symptoms: nausea   Associated symptoms: no abdominal pain, no back pain, no cough, no palpitations, no shortness of breath and not vomiting     Past Medical History  Diagnosis Date  . Hypertension   . Diabetes mellitus without complication   . High cholesterol     History reviewed. No pertinent past surgical history.  History reviewed. No pertinent family history.  History  Substance Use Topics  . Smoking status: Never Smoker   . Smokeless tobacco: Not on file  . Alcohol Use: No    OB History   Grav Para Term Preterm Abortions TAB SAB Ect Mult Living                  Review of Systems  Respiratory: Negative for cough, shortness of breath and wheezing.   Cardiovascular: Positive for chest pain. Negative for palpitations.   Gastrointestinal: Positive for nausea. Negative for vomiting, abdominal pain, diarrhea, constipation, blood in stool and abdominal distention.  Genitourinary: Negative for hematuria and difficulty urinating.  Musculoskeletal: Negative for back pain.  Skin: Negative for color change.  Hematological: Does not bruise/bleed easily.  Psychiatric/Behavioral: Negative for confusion.    Allergies  Review of patient's allergies indicates no known allergies.  Home Medications   Current Outpatient Rx  Name  Route  Sig  Dispense  Refill  . aspirin 81 MG tablet   Oral   Take 81 mg by mouth daily.         . Cholecalciferol 4000 UNITS CAPS   Oral   Take 4,000 Units by mouth daily.         Marland Kitchen gabapentin (NEURONTIN) 300 MG capsule   Oral   Take 300-600 mg by mouth daily.         . metFORMIN (GLUMETZA) 500 MG (MOD) 24 hr tablet   Oral   Take 500 mg by mouth 2 (two) times daily with a meal.         . simvastatin (ZOCOR) 40 MG tablet   Oral   Take 40 mg by mouth every evening.         . valsartan (DIOVAN) 160 MG tablet   Oral   Take 160 mg by mouth daily.           BP 107/70  Pulse 61  Temp(Src) 98.3 F (36.8 C) (Oral)  Resp 15  SpO2 99%  Physical Exam  Nursing note and vitals reviewed. Constitutional: She is oriented to person, place, and time. She appears well-developed and well-nourished.  HENT:  Head: Normocephalic and atraumatic.  Eyes: EOM are normal. Pupils are equal, round, and reactive to light.  Neck: Neck supple.  Cardiovascular: Normal rate, regular rhythm and normal heart sounds.   No murmur heard. Pulmonary/Chest: Effort normal. No respiratory distress.  Abdominal: Soft. She exhibits no distension. There is no tenderness. There is no rebound and no guarding.  Neurological: She is alert and oriented to person, place, and time.  Skin: Skin is warm and dry.    ED Course  Procedures (including critical care time)  Labs Reviewed  CBC WITH  DIFFERENTIAL  BASIC METABOLIC PANEL  URINALYSIS, ROUTINE W REFLEX MICROSCOPIC   Dg Chest Port 1 View  06/17/2012  *RADIOLOGY REPORT*  Clinical Data: Chest pain.  PORTABLE CHEST - 1 VIEW  Comparison: PA and lateral chest 02/14/2008.  Findings: Lungs are clear.  Heart size normal.  No pneumothorax or pleural effusion.  IMPRESSION: Negative chest.   Original Report Authenticated By: Holley Dexter, M.D.      No diagnosis found.    MDM   Date: 06/17/2012  Rate: 66  Rhythm: normal sinus rhythm  QRS Axis: normal  Intervals: normal  ST/T Wave abnormalities: normal  Conduction Disutrbances:none  Narrative Interpretation:   Old EKG Reviewed: changes noted - T WAVE FLATTENING  Differential diagnosis includes: ACS syndrome CHF exacerbation Valvular disorder Myocarditis Pericarditis Pericardial effusion Pneumonia Pleural effusion Pulmonary edema PE Anemia Musculoskeletal pain  Pt with hx of HTN comes in with cc of left sided chest pain, pressure like. The pain is pretty typical, responded to nitro per EMS and nitro in the ED as well. EKG shows some t wave flattening, but no acute changes. Will get 1 more ekg to check for dynamic studies. Pt to get heparin drip initiated- she already got a full dose of ASA by EMS. We will get Cards consult.   CRITICAL CARE Performed by: Derwood Kaplan   Total critical care time: 30 minutes  Critical care time was exclusive of separately billable procedures and treating other patients.  Critical care was necessary to treat or prevent imminent or life-threatening deterioration.  Critical care was time spent personally by me on the following activities: development of treatment plan with patient and/or surrogate as well as nursing, discussions with consultants, evaluation of patient's response to treatment, examination of patient, obtaining history from patient or surrogate, ordering and performing treatments and interventions, ordering and  review of laboratory studies, ordering and review of radiographic studies, pulse oximetry and re-evaluation of patient's condition.   5:13 PM [place] Cards consulted - and they will f/u. Medicine admission. Nitro and heparin gtt started.   Derwood Kaplan, MD 06/17/12 [phone]

## 2012-06-18 ENCOUNTER — Encounter (HOSPITAL_COMMUNITY)
Admission: EM | Disposition: A | Discharge status: Home / Self Care | Payer: Self-pay | Source: Home / Self Care | Attending: Internal Medicine

## 2012-06-18 HISTORY — PX: LEFT HEART CATHETERIZATION WITH CORONARY ANGIOGRAM: SHX5451

## 2012-06-18 LAB — HEPATIC FUNCTION PANEL
Albumin: 3.5 g/dL (ref 3.5–5.2)
Alkaline Phosphatase: 48 U/L (ref 39–117)
Bilirubin, Direct: <0.1 mg/dL (ref 0.0–0.3)
Total Bilirubin: 0.3 mg/dL (ref 0.3–1.2)

## 2012-06-18 LAB — GLUCOSE, CAPILLARY
Glucose-Capillary: 87 mg/dL (ref 70–99)
Glucose-Capillary: 86 mg/dL (ref 70–99)
Glucose-Capillary: 135 mg/dL — ABNORMAL HIGH (ref 70–99)

## 2012-06-18 LAB — CBC
MCH: 30.5 pg (ref 26.0–34.0)
Platelets: 207 10*3/uL (ref 150–400)
RBC: 4.06 MIL/uL (ref 3.87–5.11)
WBC: 7.5 10*3/uL (ref 4.0–10.5)

## 2012-06-18 LAB — LIPID PANEL: Total CHOL/HDL Ratio: 2.8 RATIO

## 2012-06-18 LAB — BASIC METABOLIC PANEL
BUN: 19 mg/dL (ref 6–23)
CO2: 26 mEq/L (ref 19–32)
Chloride: 105 mEq/L (ref 96–112)
GFR calc Af Amer: 84 mL/min — ABNORMAL LOW (ref >90)
Potassium: 4.1 mEq/L (ref 3.5–5.1)

## 2012-06-18 LAB — HEPARIN LEVEL (UNFRACTIONATED): Heparin Unfractionated: 0.22 IU/mL — ABNORMAL LOW (ref 0.30–0.70)

## 2012-06-18 LAB — HEMOGLOBIN A1C
Hgb A1c MFr Bld: 6.4 % — ABNORMAL HIGH (ref <5.7)
Mean Plasma Glucose: 137 mg/dL — ABNORMAL HIGH (ref <117)

## 2012-06-18 LAB — TSH: TSH: 5.69 u[IU]/mL — ABNORMAL HIGH (ref 0.350–4.500)

## 2012-06-18 SURGERY — LEFT HEART CATHETERIZATION WITH CORONARY ANGIOGRAM
Anesthesia: LOCAL

## 2012-06-18 MED ORDER — SODIUM CHLORIDE 0.9 % IV SOLN: 1.0000 mL/kg/h | INTRAVENOUS | Status: AC

## 2012-06-18 MED ORDER — SODIUM CHLORIDE 0.9 % IV SOLN: 250.0000 mL | INTRAVENOUS | Status: DC | PRN

## 2012-06-18 MED ORDER — MIDAZOLAM HCL 2 MG/2ML IJ SOLN
INTRAMUSCULAR | Status: AC
  Filled 2012-06-18: qty 2

## 2012-06-18 MED ORDER — HEPARIN (PORCINE) IN NACL 2-0.9 UNIT/ML-% IJ SOLN
INTRAMUSCULAR | Status: AC
  Filled 2012-06-18: qty 1000

## 2012-06-18 MED ORDER — SODIUM CHLORIDE 0.9 % IJ SOLN: 3.0000 mL | Freq: Two times a day (BID) | INTRAMUSCULAR | Status: DC

## 2012-06-18 MED ORDER — HEPARIN SODIUM (PORCINE) 1000 UNIT/ML IJ SOLN
INTRAMUSCULAR | Status: AC
  Filled 2012-06-18: qty 1

## 2012-06-18 MED ORDER — SODIUM CHLORIDE 0.9 % IJ SOLN: 3.0000 mL | INTRAMUSCULAR | Status: DC | PRN

## 2012-06-18 MED ORDER — VERAPAMIL HCL 2.5 MG/ML IV SOLN
INTRAVENOUS | Status: AC
  Filled 2012-06-18: qty 4

## 2012-06-18 MED ORDER — FENTANYL CITRATE 0.05 MG/ML IJ SOLN
INTRAMUSCULAR | Status: AC
  Filled 2012-06-18: qty 2

## 2012-06-18 MED ORDER — LIDOCAINE HCL (PF) 1 % IJ SOLN
INTRAMUSCULAR | Status: AC
  Filled 2012-06-18: qty 30

## 2012-06-18 NOTE — Progress Notes (Signed)
I have seen and evaluated the patient this AM along with Nada Boozer, NP. I agree with her findings, examination as well as impression recommendations.  Per initial evaluation yesterday with Dr.Kelly's concern for CP being related to ACS, we will plan for LHC/PCI   She has had some intermittent "pressure" in her L upper chesp o/n, but biomarkers are negative.  ECG abnormalities are subtle non-specific changes.  We had a [place] Language interpreter present & Boyce Medici, PA explained the R/B/B/I of procedure & consent was obtained.  For future interpretation, we will need to use her family members, since herr dialect is different from the interpreter.   Marykay Lex, M.D., M.S. [facility] [address] [place], [place]  [address]  [phone] Pager # [phone] 06/18/2012 10:40 AM

## 2012-06-18 NOTE — Progress Notes (Signed)
[facility] PROGRESS NOTE  Alexis Kaiser ZOX:[id number] DOB: Jul 24, 1940 DOA: 06/17/2012 PCP: No primary provider on file.  Assessment/Plan: 1. Chest pain - assumed initially to be unstable angina. Patient was kept on telemetry, in stepdown on heparin and nitro drips. cath 06/18/12 was negative for critical CAD 2. GERD c/w PPI   HPI/Subjective: Awaiting cath - patient seen at 3 PM   Objective: Filed Vitals:   06/18/12 1602 06/18/12 1700 06/18/12 1828 06/18/12 1900  BP:    124/80  Pulse: 61   60  Temp:  96.4 F (35.8 C) 96.4 F (35.8 C)   TempSrc:  Oral Oral   Resp:      Height:      Weight:      SpO2:    99%    Intake/Output Summary (Last 24 hours) at 06/18/12 1959 Last data filed at 06/18/12 1100  Gross per 24 hour  Intake 1012.5 ml  Output      0 ml  Net 1012.5 ml   Filed Weights   06/17/12 1500 06/17/12 2000  Weight: 58.968 kg (130 lb) 57.5 kg (126 lb 12.2 oz)    Exam:   General:  axox3  Cardiovascular: rrr  Respiratory: ctab   Abdomen: soft, nt   Musculoskeletal: intact    Data Reviewed: Basic Metabolic Panel:  Recent Labs Lab 06/17/12 1450 06/17/12 2236 06/18/12 0650  NA 138  --  141  K 4.3  --  4.1  CL 102  --  105  CO2 27  --  26  GLUCOSE 94  --  92  BUN 17  --  19  CREATININE 0.76  --  0.81  CALCIUM 10.1  --  9.4  MG  --  1.8  --    Liver Function Tests:  Recent Labs Lab 06/17/12 2236  AST 39*  ALT 36*  ALKPHOS 48  BILITOT 0.3  PROT 6.9  ALBUMIN 3.5   No results found for this basename: LIPASE, AMYLASE,  in the last 168 hours No results found for this basename: AMMONIA,  in the last 168 hours CBC:  Recent Labs Lab 06/17/12 1450 06/18/12 0650  WBC 7.5 7.5  NEUTROABS 3.6  --   HGB 12.9 12.4  HCT 37.6 36.6  MCV 90.4 90.1  PLT 209 207   Cardiac Enzymes:  Recent Labs Lab 06/17/12 2016 06/18/12 0004 06/18/12 0650  TROPONINI <0.30 <0.30 <0.30   BNP (last 3 results) No results found for this basename:  PROBNP,  in the last 8760 hours CBG:  Recent Labs Lab 06/17/12 2209 06/18/12 0746 06/18/12 1208 06/18/12 1707  GLUCAP 129* 98 87 86    Recent Results (from the past 240 hour(s))  MRSA PCR SCREENING     Status: None   Collection Time    06/17/12  8:17 PM      Result Value Range Status   MRSA by PCR NEGATIVE  NEGATIVE Final   Comment:            The GeneXpert MRSA Assay (FDA     approved for NASAL specimens     only), is one component of a     comprehensive MRSA colonization     surveillance program. It is not     intended to diagnose MRSA     infection nor to guide or     monitor treatment for     MRSA infections.     Studies: Dg Chest Port 1 View  06/17/2012  *RADIOLOGY REPORT*  Clinical Data: Chest pain.  PORTABLE CHEST - 1 VIEW  Comparison: PA and lateral chest 02/14/2008.  Findings: Lungs are clear.  Heart size normal.  No pneumothorax or pleural effusion.  IMPRESSION: Negative chest.   Original Report Authenticated By: Holley Dexter, M.D.     Scheduled Meds: . aspirin EC  81 mg Oral Daily  . gabapentin  600 mg Oral QHS  . insulin aspart  0-9 Units Subcutaneous TID WC  . irbesartan  75 mg Oral Daily  . metoprolol tartrate  12.5 mg Oral BID  . pantoprazole  40 mg Oral Q breakfast  . simvastatin  40 mg Oral QPM  . [START ON 06/19/2012] sodium chloride  3 mL Intravenous Q12H   Continuous Infusions: . sodium chloride      Principal Problem:   Chest pain Active Problems:   HTN (hypertension)   HLD (hyperlipidemia)   Diabetes mellitus type 2 in nonobese   GERD (gastroesophageal reflux disease)      Alexis Kaiser  [facility] Pager [phone]. If 7PM-7AM, please contact night-coverage at [email or web address], password [facility] 06/18/2012, 7:59 PM  LOS: 1 day

## 2012-06-18 NOTE — Progress Notes (Signed)
ANTICOAGULATION CONSULT NOTE - Follow Up Consult  Pharmacy Consult for heparin Indication: chest pain/ACS  Labs:  Recent Labs  06/17/12 1450 06/17/12 2016 06/17/12 2236 06/18/12 0004 06/18/12 0650  HGB 12.9  --   --   --   --   HCT 37.6  --   --   --   --   PLT 209  --   --   --   --   LABPROT  --   --  13.0  --   --   INR  --   --  0.99  --   --   HEPARINUNFRC  --   --  0.17*  --  0.22*  CREATININE 0.76  --   --   --   --   TROPONINI  --  <0.30  --  <0.30  --     Assessment: 72yo female remains subtherapeutic on heparin after rate increase, plan for cardiac cath this pm.  Goal of Therapy:  Heparin level 0.3-0.7 units/ml   Plan:  Will increase heparin gtt by another 2-3 units/kg/hr to 1100 units/hr and f/u after cath.  Vernard Gambles, PharmD, BCPS  06/18/2012,7:43 AM

## 2012-06-18 NOTE — Care Management Note (Signed)
    Page 1 of 1   06/18/2012     9:00:57 AM   CARE MANAGEMENT NOTE 06/18/2012  Patient:  Alexis Kaiser, Alexis Kaiser   Account Number:  [id number]  Date Initiated:  06/18/2012  Documentation initiated by:  Junius Creamer  Subjective/Objective Assessment:   adm w ch pain     Action/Plan:   lives w husband   Anticipated DC Date:     Anticipated DC Plan:        DC Planning Services  CM consult      Choice offered to / List presented to:             Status of service:   Medicare Important Message given?   (If response is "NO", the following Medicare IM given date fields will be blank) Date Medicare IM given:   Date Additional Medicare IM given:    Discharge Disposition:    Per UR Regulation:  Reviewed for med. necessity/level of care/duration of stay  If discussed at Long Length of Stay Meetings, dates discussed:    Comments:  3/28 0900 debbie Nancy Manuele rn,bsn

## 2012-06-18 NOTE — Progress Notes (Signed)
Subjective: Chest pain persists, she is also complaining of headache  Objective: Vital signs in last 24 hours: Temp:  [97.7 F (36.5 C)-98.3 F (36.8 C)] 97.7 F (36.5 C) (03/28 0743) Pulse Rate:  [56-74] 56 (03/28 0743) Resp:  [8-20] 17 (03/28 0743) BP: (88-132)/(58-80) 112/77 mmHg (03/28 0600) SpO2:  [93 %-100 %] 97 % (03/28 0743) Weight:  [57.5 kg (126 lb 12.2 oz)-58.968 kg (130 lb)] 57.5 kg (126 lb 12.2 oz) (03/27 2000) Weight change:    Intake/Output from previous day: +656 03/27 0701 - 03/28 0700 In: 726.5 [I.V.:726.5] Out: -  Intake/Output this shift:    PE: General: alert and oriented, her daughter is currently translating for me Heart: S1S2 RRR, no murmur, SB to 55 Lungs:clear without rales, rhonchi or wheezes Abd:+ BS, soft, non tender, no masses palpated Ext: no edema, 2+ pedal pulses, bil.    Lab Results:  Recent Labs  06/17/12 1450 06/18/12 0650  WBC 7.5 7.5  HGB 12.9 12.4  HCT 37.6 36.6  PLT 209 207   BMET  Recent Labs  06/17/12 1450 06/18/12 0650  NA 138 141  K 4.3 4.1  CL 102 105  CO2 27 26  GLUCOSE 94 92  BUN 17 19  CREATININE 0.76 0.81  CALCIUM 10.1 9.4    Recent Labs  06/18/12 0004 06/18/12 0650  TROPONINI <0.30 <0.30    Lab Results  Component Value Date   CHOL 146 06/18/2012   HDL 53 06/18/2012   LDLCALC 60 06/18/2012   TRIG 165* 06/18/2012   CHOLHDL 2.8 06/18/2012   No results found for this basename: HGBA1C     Lab Results  Component Value Date   TSH 1.670 01/18/2008    Hepatic Function Panel  Recent Labs  06/17/12 2236  PROT 6.9  ALBUMIN 3.5  AST 39*  ALT 36*  ALKPHOS 48  BILITOT 0.3  BILIDIR <0.1  IBILI NOT CALCULATED    Recent Labs  06/18/12 0650  CHOL 146     Studies/Results: Dg Chest Port 1 View  06/17/2012  *RADIOLOGY REPORT*  Clinical Data: Chest pain.  PORTABLE CHEST - 1 VIEW  Comparison: PA and lateral chest 02/14/2008.  Findings: Lungs are clear.  Heart size normal.  No pneumothorax or  pleural effusion.  IMPRESSION: Negative chest.   Original Report Authenticated By: Holley Dexter, M.D.     Medications: I have reviewed the patient's current medications. Scheduled Meds: . aspirin EC  81 mg Oral Daily  . gabapentin  600 mg Oral QHS  . insulin aspart  0-9 Units Subcutaneous TID WC  . irbesartan  75 mg Oral Daily  . metoprolol tartrate  12.5 mg Oral BID  . pantoprazole  40 mg Oral Q breakfast  . simvastatin  40 mg Oral QPM  . sodium chloride  3 mL Intravenous Q12H   Continuous Infusions: . sodium chloride 50 mL/hr at 06/17/12 2019  . sodium chloride 1 mL/kg/hr (06/18/12 0431)  . heparin 1,100 Units/hr (06/18/12 0742)  . nitroGLYCERIN 5 mcg/min (06/18/12 0039)   PRN Meds:.sodium chloride, acetaminophen, acetaminophen, morphine injection, ondansetron (ZOFRAN) IV, ondansetron, sodium chloride  Assessment/Plan: Principal Problem:   Chest pain Active Problems:   HTN (hypertension)   HLD (hyperlipidemia)   Diabetes mellitus type 2 in nonobese   GERD (gastroesophageal reflux disease)  PLAN: Negative MI, plan for cardiac cath today.  EKG SB at 56, first degree AV block.  Non specific inf./lat ST changes.  She does not speak [place].  Have asked nursing  staff to arrange translator for cardiac cath. Cath lab is working on this.  Pt with headache will not increase NTG but will give morphine and zofran now for nausea.   LOS: 1 day   Time spent with pt. : minutes. Glora Hulgan R 06/18/2012, 8:41 AM

## 2012-06-18 NOTE — Progress Notes (Signed)
  Echocardiogram 2D Echocardiogram has been performed.  Ellender Hose A 06/18/2012, 9:46 AM

## 2012-06-18 NOTE — CV Procedure (Signed)
[facility]     CARDIAC CATHETERIZATION REPORT  Alexis Kaiser   MRN: [id number] Jan 08, 1941   ADMIT DATE:  06/17/2012  Performing Cardiologist: Alexis Kaiser Primary Physician: No primary provider on file. Primary Cardiologist:  Alexis Bihari., MD  Procedures Performed:  Left Heart Catheterization with Native Coronary Angiography via 5 Fr Right Radial Artery access  Left Ventriculography, (RAO) 10 ml/sec for 30 ml total contrast  Indication(s): Chest Pain concerning for Unstable Angina  History: 72 y.o. female who does not speak English but is accompanied by her daughter. She has a history of DM, HTN and HLD. She developed chest pressure at 2200hrs last night which she described as severe. It was associated with nausea and tingling in her arms.  She was seen in consultation by Dr. Nicki Kaiser, who felt that her symptoms are very concerning for unstable angina. She was placed on IV nitroglycerin glycerin and heparin. She is now referred for diagnostic cardiac catheterization.  Consent: This procedure has been fully reviewed with the patient and written informed consent has been obtained  With the use of a [place] language interpreter.  Consent for signed by MD and patient with RN witness -- placed on chart.  Procedure: The patient was brought to the 2nd Floor [facility] in the fasting state and prepped and draped in the usual sterile fashion for (Right radial access. A modified Allen's test with plethysmography was performed demonstrating excellent ulnar artery collateral the right wrist. Sterile technique was used including antiseptics, cap, gloves, gown, hand hygiene, mask and sheet.  Skin prep: Chlorhexidine;  Time Out: Verified patient identification, verified procedure, site/side was marked, verified correct patient position, special equipment/implants available, medications/allergies/relevent history reviewed, required  imaging and test results available.  Performed  The right wrist was anesthetized with 1% subcutaneous Lidocaine.  The right radial artery was accessed using the Seldinger Technique with placement of a 5 Fr Glide Sheath. The sheath was aspirated and flushed.  Then a total of 10 ml of standard Radial Artery Cocktail (see medications) was infused.    A 5 Fr TIG Catheter was advanced of over a Versicore wire into the ascending Aorta.  The catheter was used to engage the Left and Right Coronary Arteries.  Multiple cineangiographic views of the both coronary artery system(s) were performed.   This catheter was then exchanged over the Long Exchange Safety J wire for an angled Pigtail catheter that was advanced across the Aortic Valve.  LV hemodynamics were measured, and Left Ventriculography was performed.  LV hemodynamics were then re-sampled, and the catheter was pulled back across the Aortic Valve for measurement of "pull-back" gradient.  The catheter was removed completely out of the body over the safety-J wire.  The sheath was removed in the Cath Lab with a TR band placed at 11 ml Air at 1645 hrs (time).  Reverse Allen's test revealed non-occlusive hemostasis.  The patient was transported to the PACU area in chest pain free, hemodynamically stable condition.   The patient  was stable before, during and following the procedure.   Patient did tolerate procedure well. There were not complications.  EBL: Less than 5 mL  Medications:  Premedication: 5 mg  Valium  Sedation:  1 mg IV Versed, 25 IV mcg Fentanyl  Contrast:  66 mL Omnipaque  3000 Units IV Heparin  Radial Cocktail: 5 mg Verapamil, 400 mcg NTG, 2 ml 2% Lidocaine in 10 ml NS  Hemodynamics:  Central  Aortic Pressure / Mean Aortic Pressure: 116/63 mmHg; 84 mmHg   LV Pressure / LV End diastolic Pressure:  115/8 mmHg; 18 mmHg  Left Ventriculography:  EF:  65%  Wall Motion: Normal  Coronary Angiographic Data: Lateral seated heart.  Left  Main:  Large-caliber, upward takeoff vessel that trifurcates into LAD, Ramus Intermedius and Circumflex  Left Anterior Descending (LAD):  Large-caliber vessel the very proximal moderate caliber diagonal branch followed by a large septal trunk. There is then a smaller second diagonal branch. The distal LAD is extremely tortuous and tapers and rate small vess el as it reaches the apex. There is an additional septal trunk that takes off just prior to the D2. Angiographically normal coronaries with minimal luminal irregularities.   Circumflex (LCx):  Moderate caliber vessel which essentially gives off 2 small OM branches but terminates as a very tortuous tapering lateral OM that courses along the inferolateral wall. Minimal irregularities.   Ramus Intermedius:  Moderate large-caliber very tortuous vessel which courses in a high OM distribution almost to the inferoapex.  Right Coronary Artery: Very large caliber dominant vessel with one major RV marginal branch. The vessel bifurcates distally into a very large extensive RPDA which reaches almost to the inferior apex and the right posterior AV groove branch which gives off one small posterior lateral branch followed femoral bifurcation into 2 smaller moderate caliber tortuous RPL branches and the AV nodal artery. Minimal luminal irregularities.  Impression:  Angiographically normal coronary arteries with no significant lesions to explain chest pain.  Normal left jugular function, hyperdynamic ventricle with upper normal to mildly elevated LVEDP.  Nonanginal chest pain.  Plan:  The patient will be transferred to 6500 post catheterization unit for radial cath care.  She will likely be ready for discharge later on this evening or tomorrow morning. I have notified Dr. Lavera Kaiser from [facility].  The case and results was discussed with the patient and daughter who served as an interpreter  The case and results was discussed with the patient's  Cardiologist.  Time Spend Directly with Patient:  35 minutes  Alexis Kaiser, M.D., M.S. [facility] [address] [place], [place]  [address]  [phone]  06/18/2012 5:03 PM

## 2012-06-19 LAB — GLUCOSE, CAPILLARY: Glucose-Capillary: 106 mg/dL — ABNORMAL HIGH (ref 70–99)

## 2012-06-19 NOTE — Discharge Summary (Signed)
Physician Discharge Summary  Alexis Kaiser MWN:[id number] DOB: 03-28-1940 DOA: 06/17/2012  PCP: Allean Found, MD  Admit date: 06/17/2012 Discharge date: 06/19/2012  Time spent: 35 minutes   Discharge Diagnoses:   Chest pain - unclear etiology-cardiac catheterization within normal limits   HTN (hypertension)   HLD (hyperlipidemia)   Diabetes mellitus type 2   GERD (gastroesophageal reflux disease)   Discharge Condition: Good  Diet recommendation: Diabetic  Filed Weights   06/17/12 1500 06/17/12 2000 06/19/12 0500  Weight: 58.968 kg (130 lb) 57.5 kg (126 lb 12.2 oz) 60.737 kg (133 lb 14.4 oz)    History of present illness:  Alexis Kaiser is a 72 y.o. female with a past medical history significant for hypertension, diabetes, hyperlipidemia, neuropathy and gastroesophageal reflux disease; came to the hospital secondary to chest pain. Patient only speaks [place] and translation during this encounter was provided by her daughter who speak good [place]. They reports chest pain localize on the left side, no radiation, pressure-like, associated shortness of breath, nausea, and sensation that she almost passed out due to the pain. They reports that her pain started just today night and at that moment patient saw that the pain will go away and decline medical attention after about an hour patient subsided and she was able to go to bed at the moment that she will called this morning the discomfort for her left-sided chest returns again. She endorses that she was not doing any x-ray showed when the pain presented. In the ED a chest x-ray demonstrated no acute or pulmonary process. EKG shows some flattening T waves changes otherwise no acute ischemic abnormalities; troponins negative x1. The rest of her blood work was unremarkable. Patient's pain was only relieved by the use of nitroglycerin. She was started on heparin drip and nitroglycerin drip do to her ongoing discomfort and [facility]ist has been called to admit the patient for further evaluation and treatment along with cardiology consultation.   Hospital Course:  1. chest pain-initial concern was for unstable angina - patient was admitted to step down unit, placed on heparin drip and nitroglycerin drip. She was seen in consultation by [facility]  Cardiology - and patient was taken to the cardiac catheterization radial suite. No significant coronary artery disease was identified. The patient will followup with [facility] cardiology and primary care physician  Procedures: Cardiac catheterization  Echocardiogram   Consultations:  [facility]  Discharge Exam: Filed Vitals:   06/18/12 2000 06/19/12 0040 06/19/12 0500 06/19/12 0747  BP: 115/68 108/63 104/66 113/64  Pulse: 60 57 57 55  Temp: 98 F (36.7 C) 98 F (36.7 C) 97.8 F (36.6 C) 97.5 F (36.4 C)  TempSrc: Oral Oral Oral Oral  Resp: 17 17 16 16   Height:      Weight:   60.737 kg (133 lb 14.4 oz)   SpO2: 99% 98% 99% 99%    General: axox3 Cardiovascular: rrr Respiratory: ctab   Discharge Instructions      Discharge Orders   Future Orders Complete By Expires     Diet - low sodium heart healthy  As directed     Increase activity slowly  As directed         Medication List    TAKE these medications       aspirin 81 MG tablet  Take 81 mg by mouth daily.     Cholecalciferol 4000 UNITS Caps  Take 4,000 Units by mouth daily.     gabapentin 300 MG capsule  Commonly known as:  NEURONTIN  Take 300-600 mg by mouth daily.     gabapentin 300 MG capsule  Commonly known as:  NEURONTIN  Take 600 mg by mouth at bedtime.     metFORMIN 500 MG (MOD) 24 hr tablet  Commonly known as:  GLUMETZA  Take 500 mg by mouth 2 (two) times daily with a meal.     simvastatin 40 MG tablet  Commonly known as:  ZOCOR  Take 40 mg by mouth every evening.     valsartan 160 MG tablet  Commonly known as:  DIOVAN  Take 160 mg by mouth daily.        Follow-up Information   Follow up with your primary care provider.      Follow up with Marykay Lex, MD. (Dr. Elissa Hefty office will call with date and time for appt. and test)    Contact information:   [address] [place] [address] [phone]        The results of significant diagnostics from this hospitalization (including imaging, microbiology, ancillary and laboratory) are listed below for reference.    Significant Diagnostic Studies: Dg Chest Port 1 View  06/17/2012  *RADIOLOGY REPORT*  Clinical Data: Chest pain.  PORTABLE CHEST - 1 VIEW  Comparison: PA and lateral chest 02/14/2008.  Findings: Lungs are clear.  Heart size normal.  No pneumothorax or pleural effusion.  IMPRESSION: Negative chest.   Original Report Authenticated By: Holley Dexter, M.D.     Microbiology: Recent Results (from the past 240 hour(s))  MRSA PCR SCREENING     Status: None   Collection Time    06/17/12  8:17 PM      Result Value Range Status   MRSA by PCR NEGATIVE  NEGATIVE Final   Comment:            The GeneXpert MRSA Assay (FDA     approved for NASAL specimens     only), is one component of a     comprehensive MRSA colonization     surveillance program. It is not     intended to diagnose MRSA     infection nor to guide or     monitor treatment for     MRSA infections.     Labs: Basic Metabolic Panel:  Recent Labs Lab 06/17/12 1450 06/17/12 2236 06/18/12 0650  NA 138  --  141  K 4.3  --  4.1  CL 102  --  105  CO2 27  --  26  GLUCOSE 94  --  92  BUN 17  --  19  CREATININE 0.76  --  0.81  CALCIUM 10.1  --  9.4  MG  --  1.8  --    Liver Function Tests:  Recent Labs Lab 06/17/12 2236  AST 39*  ALT 36*  ALKPHOS 48  BILITOT 0.3  PROT 6.9  ALBUMIN 3.5   No results found for this basename: LIPASE, AMYLASE,  in the last 168 hours No results found for this basename: AMMONIA,  in the last 168 hours CBC:  Recent  Labs Lab 06/17/12 1450 06/18/12 0650  WBC 7.5 7.5  NEUTROABS 3.6  --   HGB 12.9 12.4  HCT 37.6 36.6  MCV 90.4 90.1  PLT 209 207   Cardiac Enzymes:  Recent Labs Lab 06/17/12 2016 06/18/12 0004 06/18/12 0650  TROPONINI <0.30 <0.30 <0.30   BNP: BNP (last 3 results) No results found for this  basename: PROBNP,  in the last 8760 hours CBG:  Recent Labs Lab 06/18/12 0746 06/18/12 1208 06/18/12 1707 06/18/12 2138 06/19/12 0744  GLUCAP 98 87 86 135* 106*       Signed:  Callie Facey  [facility] 06/20/2012, 6:52 PM

## 2012-06-19 NOTE — Progress Notes (Signed)
I have seen and evaluated the patient this AM along with Nada Boozer, NP. I agree with her findings, examination as well as impression recommendations.  Excellent results from cath yesterday.  Despite this, she continues to note L sided Chest pressure.  I am not sure what the cause is, but to "close the loop" on the question of coronary insufficieny, I have provided instructions for the family to contact our Office (phone # below) Monday to arrange CPET testing to evaluate for microvascular disease.   I will be happy to see her in f/u after this.   Marykay Lex, M.D., M.S. [facility] [address] [place], [place]  [address]  [phone] Pager # [phone] 06/19/2012 10:34 AM

## 2012-06-19 NOTE — Progress Notes (Signed)
Subjective: Still with mild chest pressure  Objective: Vital signs in last 24 hours: Temp:  [96.4 F (35.8 C)-98 F (36.7 C)] 97.5 F (36.4 C) (03/29 0747) Pulse Rate:  [55-61] 55 (03/29 0747) Resp:  [11-17] 16 (03/29 0747) BP: (104-125)/(63-80) 113/64 mmHg (03/29 0747) SpO2:  [98 %-99 %] 99 % (03/29 0747) Weight:  [60.737 kg (133 lb 14.4 oz)] 60.737 kg (133 lb 14.4 oz) (03/29 0500) Weight change: 1.769 kg (3 lb 14.4 oz) Last BM Date: 06/18/12 Intake/Output from previous day: 03/28 0701 - 03/29 0700 In: 1204.5 [P.O.:240; I.V.:964.5] Out: 300 [Urine:300] Intake/Output this shift:    PE General:alert and oriented, mild chest discomfort Heart:S1S2 RRR Lungs:clear Ext:rt wrist without hematoma, + pulse    Lab Results:  Recent Labs  06/17/12 1450 06/18/12 0650  WBC 7.5 7.5  HGB 12.9 12.4  HCT 37.6 36.6  PLT 209 207   BMET  Recent Labs  06/17/12 1450 06/18/12 0650  NA 138 141  K 4.3 4.1  CL 102 105  CO2 27 26  GLUCOSE 94 92  BUN 17 19  CREATININE 0.76 0.81  CALCIUM 10.1 9.4    Recent Labs  06/18/12 0004 06/18/12 0650  TROPONINI <0.30 <0.30    Lab Results  Component Value Date   CHOL 146 06/18/2012   HDL 53 06/18/2012   LDLCALC 60 06/18/2012   TRIG 165* 06/18/2012   CHOLHDL 2.8 06/18/2012   Lab Results  Component Value Date   HGBA1C 6.4* 06/17/2012     Lab Results  Component Value Date   TSH 5.690* 06/17/2012    Hepatic Function Panel  Recent Labs  06/17/12 2236  PROT 6.9  ALBUMIN 3.5  AST 39*  ALT 36*  ALKPHOS 48  BILITOT 0.3  BILIDIR <0.1  IBILI NOT CALCULATED    Recent Labs  06/18/12 0650  CHOL 146   No results found for this basename: PROTIME,  in the last 72 hours      Studies/Results: Dg Chest Port 1 View  06/17/2012  *RADIOLOGY REPORT*  Clinical Data: Chest pain.  PORTABLE CHEST - 1 VIEW  Comparison: PA and lateral chest 02/14/2008.  Findings: Lungs are clear.  Heart size normal.  No pneumothorax or pleural effusion.   IMPRESSION: Negative chest.   Original Report Authenticated By: Holley Dexter, M.D.    Cardiac Cath 06/18/12: Impression:  Angiographically normal coronary arteries with no significant lesions to explain chest pain.  Normal left jugular function, hyperdynamic ventricle with upper normal to mildly elevated LVEDP.  Nonanginal chest pain.    Medications: I have reviewed the patient's current medications. Scheduled Meds: . aspirin EC  81 mg Oral Daily  . gabapentin  600 mg Oral QHS  . insulin aspart  0-9 Units Subcutaneous TID WC  . irbesartan  75 mg Oral Daily  . metoprolol tartrate  12.5 mg Oral BID  . pantoprazole  40 mg Oral Q breakfast  . simvastatin  40 mg Oral QPM  . sodium chloride  3 mL Intravenous Q12H   Continuous Infusions:  PRN Meds:.sodium chloride, acetaminophen, acetaminophen, morphine injection, ondansetron (ZOFRAN) IV, ondansetron, sodium chloride  Assessment/Plan: Principal Problem:   Chest pain Active Problems:   HTN (hypertension)   HLD (hyperlipidemia)   Diabetes mellitus type 2 in nonobese   GERD (gastroesophageal reflux disease)  PLAN: OK for discharge from cardiology.  Call if needed for cardiac issues, otherwise see PCP.  LOS: 2 days   Time spent with pt. 15 minutes. Shantese Raven R 06/19/2012, 8:59  AM    

## 2012-12-27 ENCOUNTER — Other Ambulatory Visit (HOSPITAL_COMMUNITY)
Admission: RE | Admit: 2012-12-27 | Discharge: 2012-12-27 | Disposition: A | Discharge status: Home / Self Care | Payer: Medicare Other | Source: Ambulatory Visit | Attending: Family Medicine | Admitting: Family Medicine

## 2012-12-27 ENCOUNTER — Other Ambulatory Visit: Payer: Self-pay | Admitting: Family Medicine

## 2012-12-27 DIAGNOSIS — Z124 Encounter for screening for malignant neoplasm of cervix: Secondary | ICD-10-CM | POA: Insufficient documentation

## 2013-03-07 ENCOUNTER — Other Ambulatory Visit: Payer: Self-pay | Admitting: Rehabilitation

## 2013-03-07 DIAGNOSIS — M25532 Pain in left wrist: Secondary | ICD-10-CM

## 2013-03-09 ENCOUNTER — Ambulatory Visit
Admission: RE | Admit: 2013-03-09 | Discharge: 2013-03-09 | Disposition: A | Discharge status: Home / Self Care | Payer: Medicare Other | Source: Ambulatory Visit | Attending: Rehabilitation | Admitting: Rehabilitation

## 2013-03-09 DIAGNOSIS — M25532 Pain in left wrist: Secondary | ICD-10-CM

## 2014-03-02 ENCOUNTER — Encounter (HOSPITAL_COMMUNITY): Payer: Self-pay | Admitting: Cardiology

## 2014-11-17 ENCOUNTER — Encounter: Payer: Self-pay | Admitting: Cardiology

## 2015-09-26 ENCOUNTER — Other Ambulatory Visit: Payer: Self-pay

## 2015-12-18 ENCOUNTER — Other Ambulatory Visit: Payer: Self-pay | Admitting: Family Medicine

## 2015-12-18 DIAGNOSIS — R7989 Other specified abnormal findings of blood chemistry: Secondary | ICD-10-CM

## 2015-12-18 DIAGNOSIS — R945 Abnormal results of liver function studies: Principal | ICD-10-CM

## 2016-01-01 ENCOUNTER — Ambulatory Visit
Admission: RE | Admit: 2016-01-01 | Discharge: 2016-01-01 | Disposition: A | Discharge status: Home / Self Care | Payer: Medicare Other | Source: Ambulatory Visit | Attending: Family Medicine | Admitting: Family Medicine

## 2016-01-01 DIAGNOSIS — R945 Abnormal results of liver function studies: Principal | ICD-10-CM

## 2016-01-01 DIAGNOSIS — R7989 Other specified abnormal findings of blood chemistry: Secondary | ICD-10-CM

## 2017-07-06 IMAGING — US US ABDOMEN COMPLETE
1 series · 13 of 25 positions shown · non-contrast
Comparison: None in PACs

CLINICAL DATA: Elevated liver function studies ; history of
hyperlipidemia, diabetes.

EXAM:
ABDOMEN ULTRASOUND COMPLETE

[Series 1: us abdomen complete · 0.28mm/px · 13 of 87 slices shown]
[im 1/87]
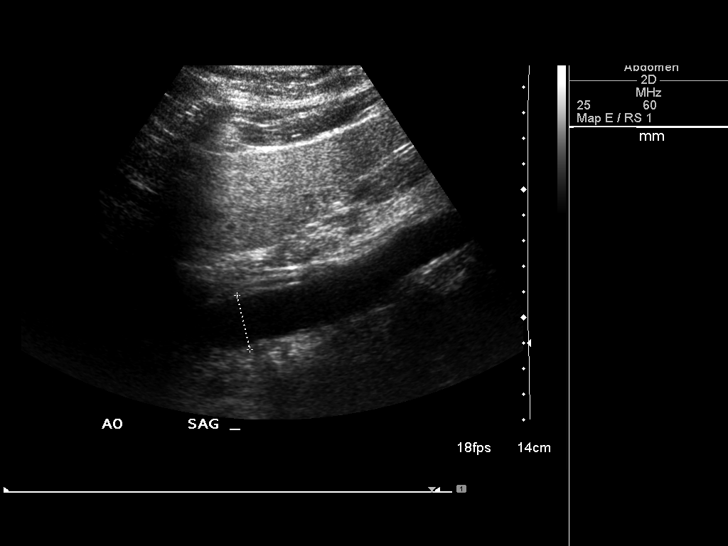
[im 8/87]
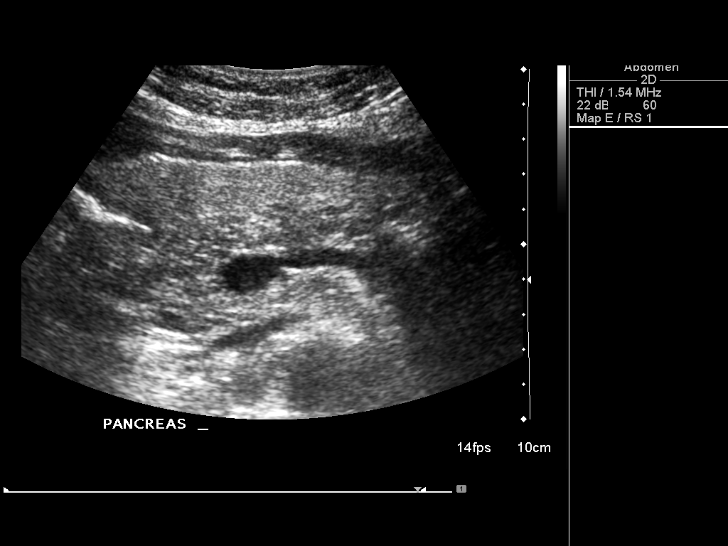
[im 15/87]
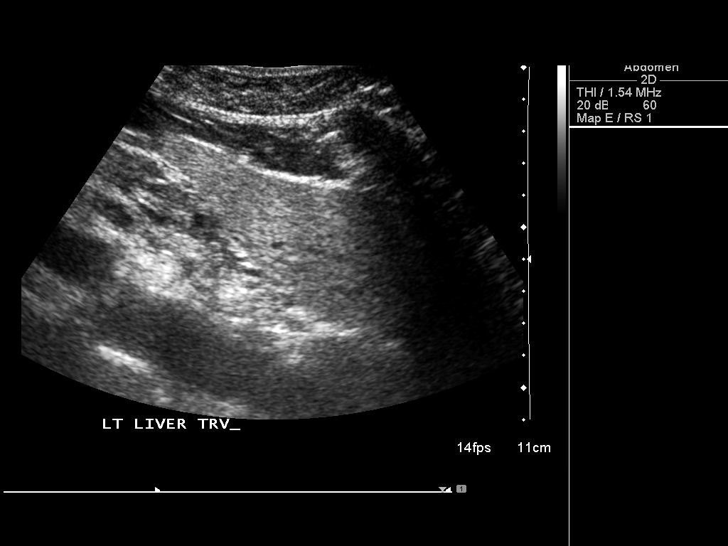
[im 22/87]
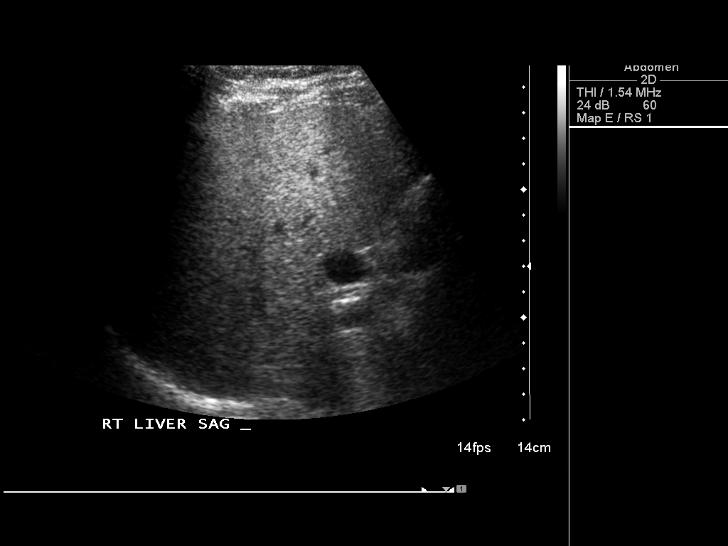
[im 29/87]
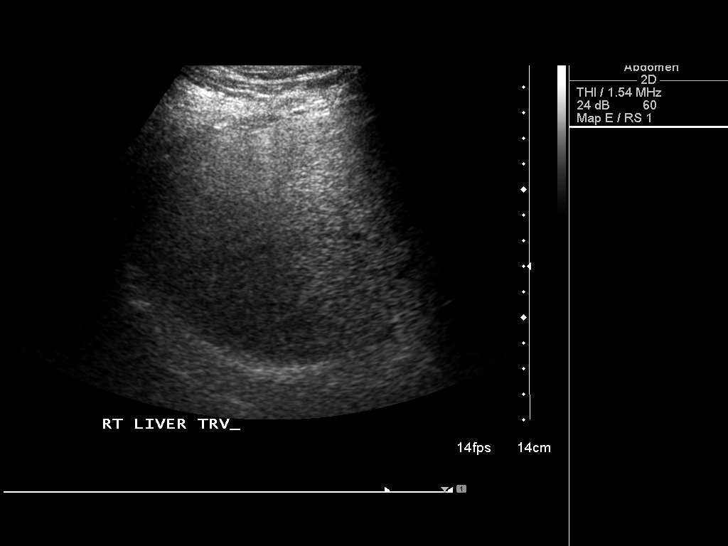
[im 36/87]
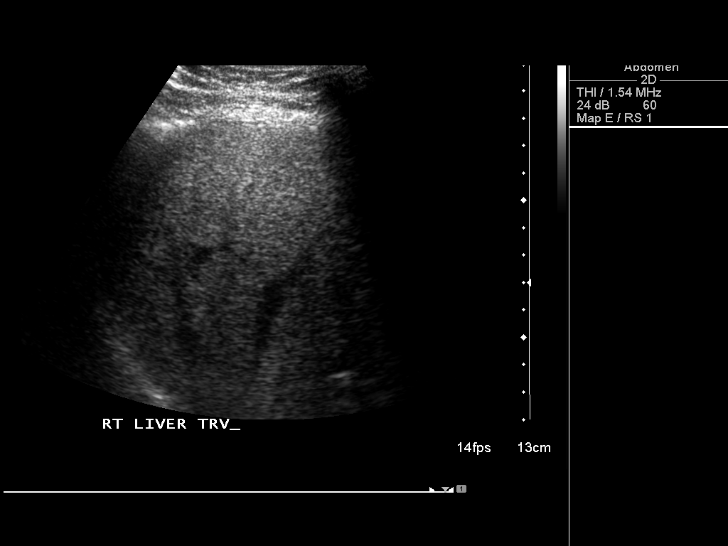
[im 44/87]
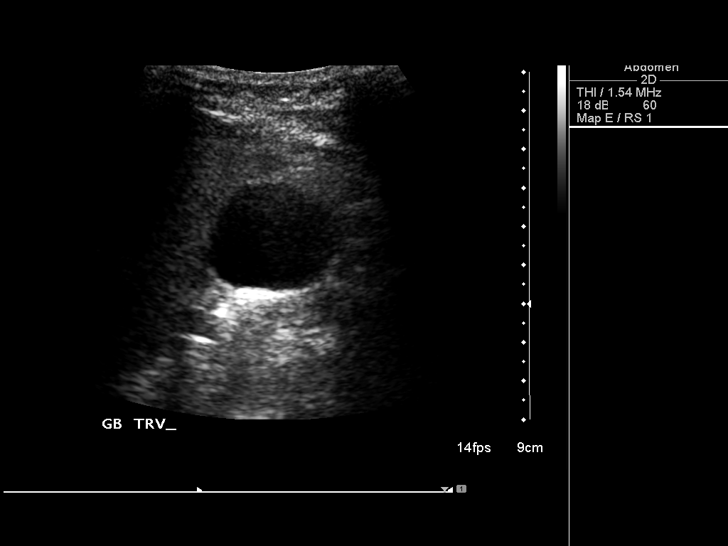
[im 51/87]
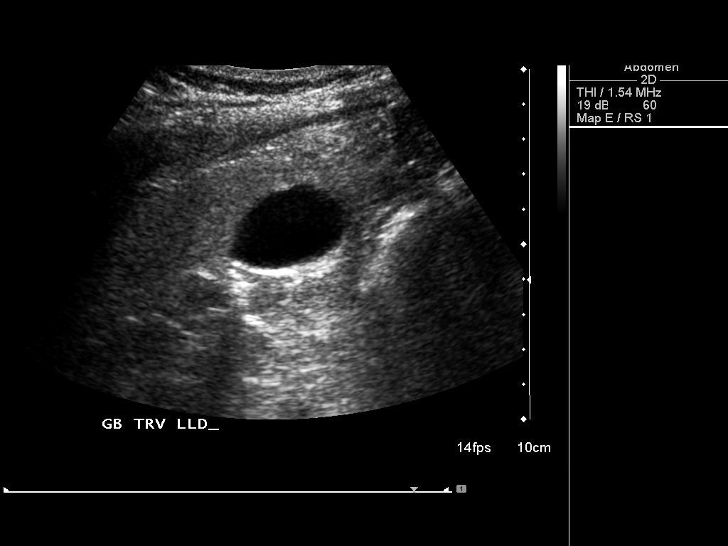
[im 58/87]
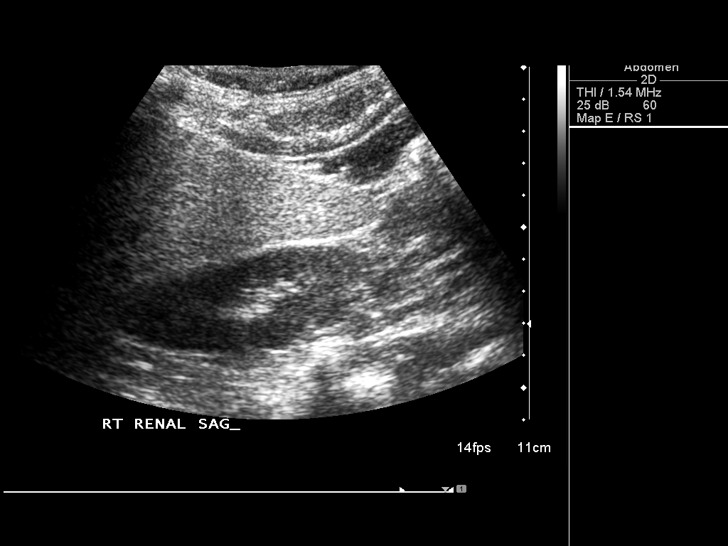
[im 65/87]
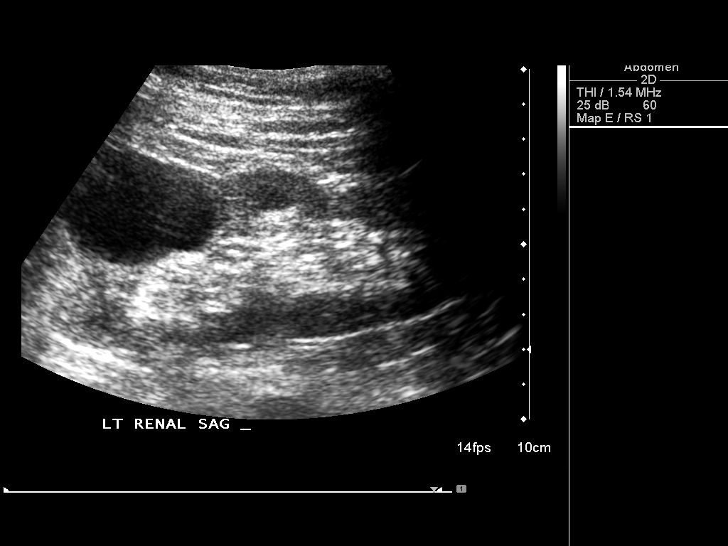
[im 72/87]
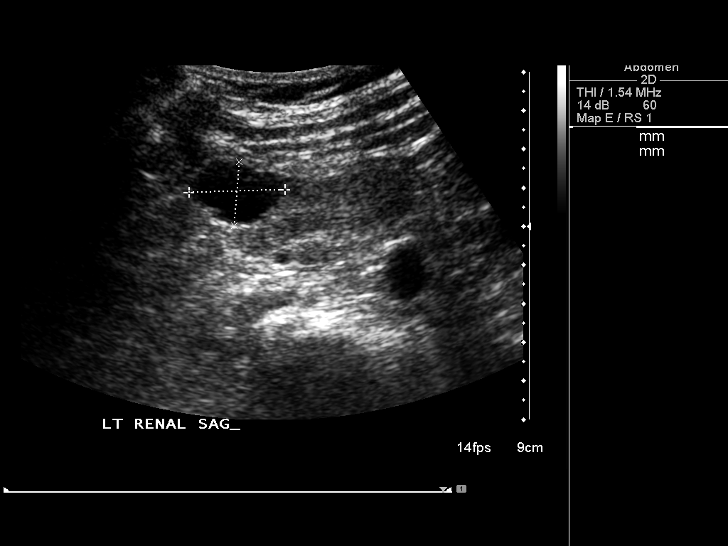
[im 79/87]
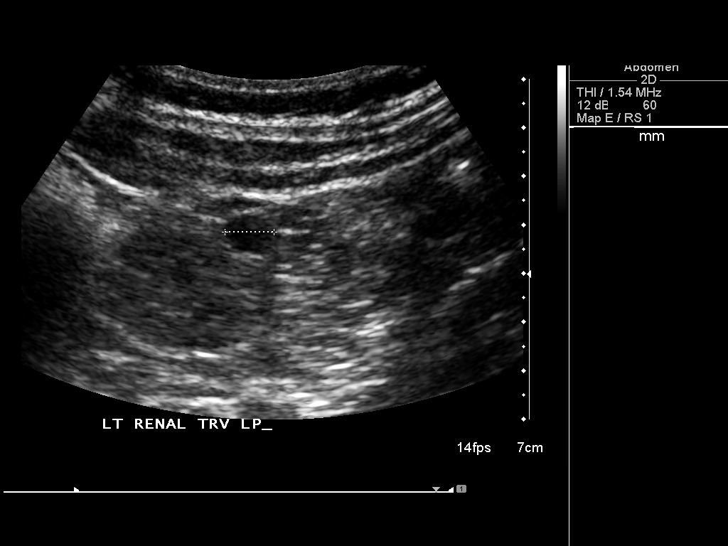
[im 87/87]
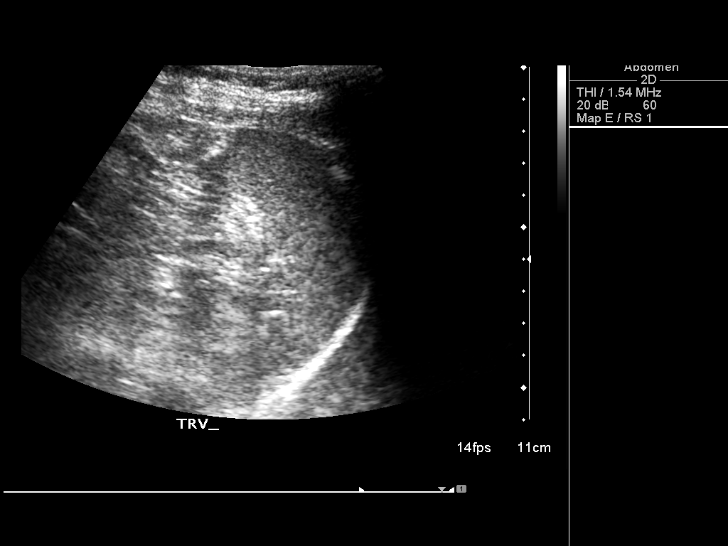

[13 of 25 positions shown; findings below may reference images not displayed]

FINDINGS: Gallbladder: No gallstones or wall thickening visualized. No
sonographic Murphy sign noted by sonographer.

Common bile duct: Diameter: 5.6 mm

Liver: The hepatic echotexture is mildly increased diffusely. There
is no focal mass nor ductal dilation. The surface contour of the
liver remains smooth.

IVC: No abnormality visualized.

Pancreas: Visualized portion unremarkable.

Spleen: Size and appearance within normal limits.

Right Kidney: Length: 11.1 cm. Echogenicity within normal limits. No
mass or hydronephrosis visualized.

Left Kidney: Length: 10.6 cm. The cortical echotexture is normal.
There is an upper pole cyst which measures 4.6 x 3.7 x 4.0 cm. There
is a midpole cyst which measures 2.5 x 1.7 x 1.8 cm. There is a
lower pole cyst measuring 1 cm in diameter. There is no solid mass
nor hydronephrosis.

Abdominal aorta: No aneurysm visualized.

Other findings: There is no ascites.
IMPRESSION: 1. No gallstones or sonographic evidence of acute cholecystitis. If
there are clinical concerns of chronic gallbladder dysfunction, a
nuclear medicine hepatobiliary scan with gallbladder ejection
fraction determination may be useful.
2. Increased hepatic echotexture consistent with fatty infiltrative
change. No focal hepatic mass nor evidence of ductal dilation.
3. Simple appearing cortical cysts in the left kidney.

## 2019-05-08 ENCOUNTER — Ambulatory Visit: Payer: Medicare Other | Attending: Internal Medicine

## 2019-05-08 DIAGNOSIS — Z23 Encounter for immunization: Secondary | ICD-10-CM | POA: Insufficient documentation

## 2019-05-08 NOTE — Progress Notes (Signed)
   Covid-19 Vaccination Clinic  Name:  Alexis Kaiser    MRN: [id number] DOB: [birth year]  05/08/2019  Alexis Kaiser was observed post Covid-19 immunization for 15 minutes without incidence. She was provided with Vaccine Information Sheet and instruction to access the V-Safe system.   Alexis Kaiser was instructed to call 911 with any severe reactions post vaccine: Marland Kitchen Difficulty breathing  . Swelling of your face and throat  . A fast heartbeat  . A bad rash all over your body  . Dizziness and weakness    Immunizations Administered    Name Date Dose VIS Date Route   Pfizer COVID-19 Vaccine 05/08/2019 12:28 PM 0.3 mL 03/04/2019 Intramuscular   Manufacturer: [employer], [employer]   Lot: [id number]   NDC: [address]-9

## 2019-05-31 ENCOUNTER — Ambulatory Visit: Payer: Medicare Other | Attending: Internal Medicine

## 2019-05-31 DIAGNOSIS — Z23 Encounter for immunization: Secondary | ICD-10-CM

## 2019-05-31 NOTE — Progress Notes (Signed)
   Covid-19 Vaccination Clinic  Name:  Alexis Kaiser    MRN: [id number] DOB: 02-01-1941  05/31/2019  [name] was observed post Covid-19 immunization for 15 minutes without incident. She was provided with Vaccine Information Sheet and instruction to access the V-Safe system.   [name] was instructed to call 911 with any severe reactions post vaccine: Marland Kitchen Difficulty breathing  . Swelling of face and throat  . A fast heartbeat  . A bad rash all over body  . Dizziness and weakness   Immunizations Administered    Name Date Dose VIS Date Route   Pfizer COVID-19 Vaccine 05/31/2019  5:07 PM 0.3 mL 03/04/2019 Intramuscular   Manufacturer: [employer], [employer]   Lot: [id number]   NDC: [address]-6

## 2019-07-17 ENCOUNTER — Encounter (HOSPITAL_COMMUNITY): Payer: Self-pay | Admitting: Emergency Medicine

## 2019-07-17 ENCOUNTER — Other Ambulatory Visit: Payer: Self-pay

## 2019-07-17 ENCOUNTER — Ambulatory Visit (HOSPITAL_COMMUNITY)
Admission: EM | Admit: 2019-07-17 | Discharge: 2019-07-17 | Disposition: A | Discharge status: Home / Self Care | Payer: Medicare Other

## 2019-07-17 DIAGNOSIS — M5442 Lumbago with sciatica, left side: Secondary | ICD-10-CM

## 2019-07-17 MED ORDER — PREDNISONE 10 MG PO TABS: ORAL_TABLET | ORAL | 0 refills | Status: AC

## 2019-07-17 MED ORDER — ACETAMINOPHEN 500 MG PO TABS: 1000.0000 mg | ORAL_TABLET | Freq: Three times a day (TID) | ORAL | 0 refills | Status: AC | PRN

## 2019-07-17 NOTE — Discharge Instructions (Signed)
Take the prednisone: 2 tablets , 2 times a day for 2 days, then 2 tablets 1 time a day for 3 days Take 2 extra tylenol every 8 hours  Stop the ibuprofen She may continue the gabapentin in the evening  Schedule follow up with her Primary care in about 1 week  If she develops more weakness, she is unable to control her urine or bowels please have her return or go to the emergency department.  If you mental status changes or she becomes confused with the prednisone , please stop this medicine and follow up with there Primary care

## 2019-07-17 NOTE — ED Triage Notes (Signed)
PT did yard work 1 week ago. 4 days ago she began having lower back pain and leg pain. Reports pain started in left leg and shoots up to low back. Reports slight numbness over left leg.   Denies numbness / pain / tingling in left arm or left side of face.No incontinence.  Endorses urinary frequency for a few months

## 2019-07-17 NOTE — ED Provider Notes (Signed)
[facility]    CSN: [id number] Arrival date & time: 07/17/19  1641      History   Chief Complaint Chief Complaint  Patient presents with  . Back Pain  . Leg Pain    HPI Alexis Kaiser is a 79 y.o. female.   Patient reports to urgent care accompanied by her daughter who functions as a Systems developer.  Patient has a history of diabetes and hypertension and is here with concern of low back pain as well as left-sided leg pain.  It is reported that about 4 days ago patient started experiencing left leg pain that is since radiated up into her left side of her lower back.  She now reports low back pain that shoots down through her leg.  She has some numbness and tingling on the top and side of her left leg.  Pain is 10/10 at its worst but lowers to about a 5-6 out of 10 with ibuprofen and the gabapentin she has been taking as needed.  Pain is worse when laying down at times.  Patient and her daughter state that they believe this is related to her doing yard work in the days prior to the pain starting.  Patient was using a shovel and dating holes for her plants, primarily utilizing her left leg to put the shovel on the ground.  She was also doing a lot of bending activity during this yardwork..  She has the gabapentin for history of low back pain that she takes at night.  Pain is worse with bending and walking.  She is able to ambulate and has not had falls.  There have been no fevers or chills.  There is no upper body numbness tingling or weakness.  No direct or traumatic back injuries previously.  She is fairly active prior to this.  She has had some increased frequency of urination over the last few months.  This is not acutely worse.  She has no trouble controlling her bowel or bladder.  No painful urination.     Past Medical History:  Diagnosis Date  . Diabetes mellitus without complication (HCC)   . High cholesterol   . Hypertension     Patient Active Problem List     Diagnosis Date Noted  . Chest pain 06/17/2012  . HTN (hypertension) 06/17/2012  . HLD (hyperlipidemia) 06/17/2012  . Diabetes mellitus type 2 in nonobese (HCC) 06/17/2012  . GERD (gastroesophageal reflux disease) 06/17/2012    Past Surgical History:  Procedure Laterality Date  . FRACTURE SURGERY    . LEFT HEART CATHETERIZATION WITH CORONARY ANGIOGRAM N/A 06/18/2012   Procedure: LEFT HEART CATHETERIZATION WITH CORONARY ANGIOGRAM;  Surgeon: Marykay Lex, MD;  Location: [facility] CATH LAB;  Service: Cardiovascular;  Laterality: N/A;    OB History   No obstetric history on file.      Home Medications    Prior to Admission medications   Medication Sig Start Date End Date Taking? Authorizing Provider  aspirin 81 MG tablet Take 81 mg by mouth daily.   Yes [provider]  gabapentin (NEURONTIN) 300 MG capsule Take 300-600 mg by mouth daily.   Yes [provider]  losartan (COZAAR) 100 MG tablet Take 100 mg by mouth daily.   Yes [provider]  metFORMIN (GLUMETZA) 500 MG (MOD) 24 hr tablet Take 500 mg by mouth 2 (two) times daily with a meal.   Yes [provider]  rosuvastatin (CRESTOR) 10 MG tablet Take 10  mg by mouth daily. Dose unknown   Yes [provider]  acetaminophen (TYLENOL) 500 MG tablet Take 2 tablets (1,000 mg total) by mouth every 8 (eight) hours as needed for moderate pain. 07/17/19   Bobi Daudelin, Veryl Speak, PA-C  Cholecalciferol 4000 UNITS CAPS Take 4,000 Units by mouth daily.    [provider]  gabapentin (NEURONTIN) 300 MG capsule Take 600 mg by mouth at bedtime.    [provider]  predniSONE (DELTASONE) 10 MG tablet Take 2 tablets (20 mg total) by mouth 2 (two) times daily with a meal for 2 days, THEN 2 tablets (20 mg total) daily with breakfast for 3 days. 07/17/19 07/22/19  Terrika Zuver, Veryl Speak, PA-C  simvastatin (ZOCOR) 40 MG tablet Take 40 mg by mouth every evening.    [provider]  valsartan (DIOVAN) 160 MG  tablet Take 160 mg by mouth daily.    [provider]    Family History No family history on file.  Social History Social History   Tobacco Use  . Smoking status: Never Smoker  . Smokeless tobacco: Never Used  Substance Use Topics  . Alcohol use: No    Alcohol/week: 0.0 standard drinks  . Drug use: No     Allergies   Patient has no known allergies.   Review of Systems Review of Systems See HPI  Physical Exam Triage Vital Signs ED Triage Vitals  Enc Vitals Group     BP 07/17/19 1721 135/82     Pulse Rate 07/17/19 1721 72     Resp 07/17/19 1721 16     Temp 07/17/19 1721 98.9 F (37.2 C)     Temp Source 07/17/19 1721 Oral     SpO2 07/17/19 1721 98 %     Weight --      Height --      Head Circumference --      Peak Flow --      Pain Score 07/17/19 1724 5     Pain Loc --      Pain Edu? --      Excl. in GC? --    No data found.  Updated Vital Signs BP 135/82   Pulse 72   Temp 98.9 F (37.2 C) (Oral)   Resp 16   SpO2 98%   Visual Acuity Right Eye Distance:   Left Eye Distance:   Bilateral Distance:    Right Eye Near:   Left Eye Near:    Bilateral Near:     Physical Exam Vitals and nursing note reviewed.  Constitutional:      General: She is not in acute distress.    Appearance: Normal appearance. She is well-developed. She is not ill-appearing.  HENT:     Head: Normocephalic and atraumatic.  Cardiovascular:     Rate and Rhythm: Normal rate.     Heart sounds: No murmur.  Pulmonary:     Effort: Pulmonary effort is normal. No respiratory distress.  Musculoskeletal:     Cervical back: Neck supple.     Comments: Patient is ambulatory with slight limp favoring the left side.  Fluid gait while walking.  No obvious deformity of the lower leg.  There is tenderness and spasm in left lumbar paraspinals.  No midline tenderness.  Mild tenderness over the lateral aspect of the left thigh.  Strength 5/5 on right, 4/5 on left and lower  extremity.  Upper extremities 5/5 throughout.  Skin:    General: Skin is warm and dry.  Neurological:     Mental Status: She is alert and oriented to person, place, and time.     Coordination: Coordination normal.     Comments: Mildly diminished sensation to light touch in the left lateral thigh compared to right.  Lower legs equal bilaterally.        UC Treatments / Results  Labs (all labs ordered are listed, but only abnormal results are displayed) Labs Reviewed - No data to display  EKG   Radiology No results found.  Procedures Procedures (including critical care time)  Medications Ordered in UC Medications - No data to display  Initial Impression / Assessment and Plan / UC Course  I have reviewed the triage vital signs and the nursing notes.  Pertinent labs & imaging results that were available during my care of the patient were reviewed by me and considered in my medical decision making (see chart for details).     #Low back pain with sciatica Patient 79 year old presenting with symptoms consistent with a low back pain with associated sciatica.  No alarm signs.  Discussion with any physician on about pain management options and shared decision making with patient and patient's daughter about use of steroids.  Believe she will benefit from a short course of steroids as she has not had significant improvement in her back pain with ibuprofen.  I  did discuss with patient and her daughter the rare possibility of mental status changes of prednisone.  Daughter reports that patient lives with her son and will have somebody to be able to check on her.  Will start on prednisone 40 mg for 2 days and then 20 for 3 following.  Patient to continue Tylenol and stop ibuprofen.  To continue gabapentin as needed at night as previously prescribed.  Patient to follow-up with primary care in 1 week for reevaluation. -Alarm symptoms were discussed such as fever, worsening weakness or inability  control bladder or bowels that she should return or report to the emergency department. Final Clinical Impressions(s) / UC Diagnoses   Final diagnoses:  Acute left-sided low back pain with left-sided sciatica     Discharge Instructions     Take the prednisone: 2 tablets , 2 times a day for 2 days, then 2 tablets 1 time a day for 3 days Take 2 extra tylenol every 8 hours  Stop the ibuprofen She may continue the gabapentin in the evening  Schedule follow up with her Primary care in about 1 week  If she develops more weakness, she is unable to control her urine or bowels please have her return or go to the emergency department.  If you mental status changes or she becomes confused with the prednisone , please stop this medicine and follow up with there Primary care      ED Prescriptions    Medication Sig Dispense Auth. Provider   predniSONE (DELTASONE) 10 MG tablet Take 2 tablets (20 mg total) by mouth 2 (two) times daily with a meal for 2 days, THEN 2 tablets (20 mg total) daily with breakfast for 3 days. 14 tablet Aurthur Wingerter, Marguerita Beards, PA-C   acetaminophen (TYLENOL) 500 MG tablet Take 2 tablets (1,000 mg total) by mouth every 8 (eight) hours as needed for moderate pain. 30 tablet [place] Rios, Marguerita Beards, PA-C     PDMP not reviewed this encounter.   Purnell Shoemaker, PA-C 07/17/19 1824

## 2020-01-31 ENCOUNTER — Other Ambulatory Visit (HOSPITAL_COMMUNITY): Payer: Self-pay | Admitting: Family Medicine

## 2020-01-31 ENCOUNTER — Other Ambulatory Visit (HOSPITAL_COMMUNITY): Payer: Self-pay | Admitting: Physician Assistant

## 2020-01-31 NOTE — Progress Notes (Signed)
I connected by phone with Exie Parody on 01/31/2020 at 5:27 PM to discuss the potential use of a new treatment for mild to moderate COVID-19 viral infection in non-hospitalized patients.  This patient is a 79 y.o. female that meets the FDA criteria for Emergency Use Authorization of COVID monoclonal antibody casirivimab/imdevimab, bamlanivimab/eteseviamb, or sotrovimab.  Has a (+) direct SARS-CoV-2 viral test result  Has mild or moderate COVID-19   Is NOT hospitalized due to COVID-19  Is within 10 days of symptom onset  Has at least one of the high risk factor(s) for progression to severe COVID-19 and/or hospitalization as defined in EUA.  Specific high risk criteria : Older age (>/= 77 yo), Diabetes and Cardiovascular disease or hypertension   I have spoken and communicated the following to the patient or parent/caregiver regarding COVID monoclonal antibody treatment:  1. FDA has authorized the emergency use for the treatment of mild to moderate COVID-19 in adults and pediatric patients with positive results of direct SARS-CoV-2 viral testing who are 32 years of age and older weighing at least 40 kg, and who are at high risk for progressing to severe COVID-19 and/or hospitalization.  2. The significant known and potential risks and benefits of COVID monoclonal antibody, and the extent to which such potential risks and benefits are unknown.  3. Information on available alternative treatments and the risks and benefits of those alternatives, including clinical trials.  4. Patients treated with COVID monoclonal antibody should continue to self-isolate and use infection control measures (e.g., wear mask, isolate, social distance, avoid sharing personal items, clean and disinfect "high touch" surfaces, and frequent handwashing) according to CDC guidelines.   5. The patient or parent/caregiver has the option to accept or refuse COVID monoclonal antibody treatment.  After reviewing this  information with the patient, the patient has agreed to receive one of the available covid 19 monoclonal antibodies and will be provided an appropriate fact sheet prior to infusion. Jodell Cipro, PA-C 01/31/2020 5:27 PM

## 2020-02-01 ENCOUNTER — Other Ambulatory Visit (HOSPITAL_COMMUNITY): Payer: Self-pay

## 2020-02-01 ENCOUNTER — Ambulatory Visit (HOSPITAL_COMMUNITY)
Admission: RE | Admit: 2020-02-01 | Discharge: 2020-02-01 | Disposition: A | Discharge status: Home / Self Care | Payer: Medicare Other | Source: Ambulatory Visit | Attending: Pulmonary Disease | Admitting: Pulmonary Disease

## 2020-02-01 DIAGNOSIS — U071 COVID-19: Secondary | ICD-10-CM | POA: Insufficient documentation

## 2020-02-01 DIAGNOSIS — Z23 Encounter for immunization: Secondary | ICD-10-CM | POA: Diagnosis not present

## 2020-02-01 MED ORDER — FAMOTIDINE IN NACL 20-0.9 MG/50ML-% IV SOLN: 20.0000 mg | Freq: Once | INTRAVENOUS | Status: DC | PRN

## 2020-02-01 MED ORDER — ALBUTEROL SULFATE HFA 108 (90 BASE) MCG/ACT IN AERS: 2.0000 | INHALATION_SPRAY | Freq: Once | RESPIRATORY_TRACT | Status: DC | PRN

## 2020-02-01 MED ORDER — EPINEPHRINE 0.3 MG/0.3ML IJ SOAJ: 0.3000 mg | Freq: Once | INTRAMUSCULAR | Status: DC | PRN

## 2020-02-01 MED ORDER — SODIUM CHLORIDE 0.9 % IV SOLN: INTRAVENOUS | Status: DC | PRN

## 2020-02-01 MED ORDER — SOTROVIMAB 500 MG/8ML IV SOLN
500.0000 mg | Freq: Once | INTRAVENOUS | Status: AC
  Administered 2020-02-01: 500 mg via INTRAVENOUS

## 2020-02-01 MED ORDER — ACETAMINOPHEN 325 MG PO TABS
650.0000 mg | ORAL_TABLET | Freq: Once | ORAL | Status: AC
  Administered 2020-02-01: 650 mg via ORAL
  Filled 2020-02-01: qty 2

## 2020-02-01 MED ORDER — METHYLPREDNISOLONE SODIUM SUCC 125 MG IJ SOLR
125.0000 mg | Freq: Once | INTRAMUSCULAR | Status: DC | PRN
Start: 2020-02-01 — End: 2020-02-02

## 2020-02-01 MED ORDER — SODIUM CHLORIDE 0.9 % IV BOLUS
1000.0000 mL | Freq: Once | INTRAVENOUS | Status: AC
  Administered 2020-02-01: 1000 mL via INTRAVENOUS

## 2020-02-01 MED ORDER — DIPHENHYDRAMINE HCL 50 MG/ML IJ SOLN: 50.0000 mg | Freq: Once | INTRAMUSCULAR | Status: DC | PRN

## 2020-02-01 NOTE — Discharge Instructions (Signed)

## 2020-02-01 NOTE — Progress Notes (Signed)
Diagnosis: COVID-19  Physician: Dr. Wright  Procedure: [facility] Med: Sotrovimab infusion - Provided patient with Sotrovimab fact sheet for patients, parents and caregivers prior to infusion.  Complications: No immediate complications noted.  Discharge: Discharged home   Keagan Brislin J Soumya Colson 02/01/2020 

## 2020-06-21 DIAGNOSIS — E1169 Type 2 diabetes mellitus with other specified complication: Secondary | ICD-10-CM | POA: Diagnosis not present

## 2020-06-21 DIAGNOSIS — I10 Essential (primary) hypertension: Secondary | ICD-10-CM | POA: Diagnosis not present

## 2020-06-21 DIAGNOSIS — Z1389 Encounter for screening for other disorder: Secondary | ICD-10-CM | POA: Diagnosis not present

## 2020-06-21 DIAGNOSIS — E782 Mixed hyperlipidemia: Secondary | ICD-10-CM | POA: Diagnosis not present

## 2020-06-21 DIAGNOSIS — Z0000 Encounter for general adult medical examination without abnormal findings: Secondary | ICD-10-CM | POA: Diagnosis not present

## 2020-06-21 DIAGNOSIS — K760 Fatty (change of) liver, not elsewhere classified: Secondary | ICD-10-CM | POA: Diagnosis not present

## 2020-07-09 DIAGNOSIS — Z1231 Encounter for screening mammogram for malignant neoplasm of breast: Secondary | ICD-10-CM | POA: Diagnosis not present

## 2020-09-06 DIAGNOSIS — M810 Age-related osteoporosis without current pathological fracture: Secondary | ICD-10-CM | POA: Diagnosis not present

## 2020-09-06 DIAGNOSIS — G4700 Insomnia, unspecified: Secondary | ICD-10-CM | POA: Diagnosis not present

## 2020-09-06 DIAGNOSIS — E1169 Type 2 diabetes mellitus with other specified complication: Secondary | ICD-10-CM | POA: Diagnosis not present

## 2020-09-06 DIAGNOSIS — E782 Mixed hyperlipidemia: Secondary | ICD-10-CM | POA: Diagnosis not present

## 2020-09-06 DIAGNOSIS — I10 Essential (primary) hypertension: Secondary | ICD-10-CM | POA: Diagnosis not present

## 2020-09-06 DIAGNOSIS — E119 Type 2 diabetes mellitus without complications: Secondary | ICD-10-CM | POA: Diagnosis not present

## 2020-09-06 DIAGNOSIS — K219 Gastro-esophageal reflux disease without esophagitis: Secondary | ICD-10-CM | POA: Diagnosis not present

## 2020-12-11 DIAGNOSIS — K219 Gastro-esophageal reflux disease without esophagitis: Secondary | ICD-10-CM | POA: Diagnosis not present

## 2020-12-11 DIAGNOSIS — I10 Essential (primary) hypertension: Secondary | ICD-10-CM | POA: Diagnosis not present

## 2020-12-11 DIAGNOSIS — G4700 Insomnia, unspecified: Secondary | ICD-10-CM | POA: Diagnosis not present

## 2020-12-11 DIAGNOSIS — E1169 Type 2 diabetes mellitus with other specified complication: Secondary | ICD-10-CM | POA: Diagnosis not present

## 2020-12-11 DIAGNOSIS — E782 Mixed hyperlipidemia: Secondary | ICD-10-CM | POA: Diagnosis not present

## 2020-12-11 DIAGNOSIS — M810 Age-related osteoporosis without current pathological fracture: Secondary | ICD-10-CM | POA: Diagnosis not present

## 2020-12-11 DIAGNOSIS — E119 Type 2 diabetes mellitus without complications: Secondary | ICD-10-CM | POA: Diagnosis not present

## 2020-12-24 DIAGNOSIS — E1169 Type 2 diabetes mellitus with other specified complication: Secondary | ICD-10-CM | POA: Diagnosis not present

## 2020-12-24 DIAGNOSIS — Z7984 Long term (current) use of oral hypoglycemic drugs: Secondary | ICD-10-CM | POA: Diagnosis not present

## 2020-12-24 DIAGNOSIS — Z23 Encounter for immunization: Secondary | ICD-10-CM | POA: Diagnosis not present

## 2020-12-24 DIAGNOSIS — M810 Age-related osteoporosis without current pathological fracture: Secondary | ICD-10-CM | POA: Diagnosis not present

## 2020-12-24 DIAGNOSIS — I10 Essential (primary) hypertension: Secondary | ICD-10-CM | POA: Diagnosis not present

## 2020-12-24 DIAGNOSIS — E782 Mixed hyperlipidemia: Secondary | ICD-10-CM | POA: Diagnosis not present

## 2021-01-14 DIAGNOSIS — E119 Type 2 diabetes mellitus without complications: Secondary | ICD-10-CM | POA: Diagnosis not present

## 2021-06-24 DIAGNOSIS — Z1159 Encounter for screening for other viral diseases: Secondary | ICD-10-CM | POA: Diagnosis not present

## 2021-06-24 DIAGNOSIS — Z0000 Encounter for general adult medical examination without abnormal findings: Secondary | ICD-10-CM | POA: Diagnosis not present

## 2021-06-24 DIAGNOSIS — Z23 Encounter for immunization: Secondary | ICD-10-CM | POA: Diagnosis not present

## 2021-06-24 DIAGNOSIS — E782 Mixed hyperlipidemia: Secondary | ICD-10-CM | POA: Diagnosis not present

## 2021-06-24 DIAGNOSIS — E1169 Type 2 diabetes mellitus with other specified complication: Secondary | ICD-10-CM | POA: Diagnosis not present

## 2021-06-24 DIAGNOSIS — Z1389 Encounter for screening for other disorder: Secondary | ICD-10-CM | POA: Diagnosis not present

## 2021-06-24 DIAGNOSIS — M109 Gout, unspecified: Secondary | ICD-10-CM | POA: Diagnosis not present

## 2021-06-24 DIAGNOSIS — I10 Essential (primary) hypertension: Secondary | ICD-10-CM | POA: Diagnosis not present

## 2021-06-24 DIAGNOSIS — K760 Fatty (change of) liver, not elsewhere classified: Secondary | ICD-10-CM | POA: Diagnosis not present

## 2021-07-15 DIAGNOSIS — Z1231 Encounter for screening mammogram for malignant neoplasm of breast: Secondary | ICD-10-CM | POA: Diagnosis not present

## 2021-12-26 DIAGNOSIS — I10 Essential (primary) hypertension: Secondary | ICD-10-CM | POA: Diagnosis not present

## 2021-12-26 DIAGNOSIS — K5901 Slow transit constipation: Secondary | ICD-10-CM | POA: Diagnosis not present

## 2021-12-26 DIAGNOSIS — E1169 Type 2 diabetes mellitus with other specified complication: Secondary | ICD-10-CM | POA: Diagnosis not present

## 2021-12-26 DIAGNOSIS — E559 Vitamin D deficiency, unspecified: Secondary | ICD-10-CM | POA: Diagnosis not present

## 2021-12-26 DIAGNOSIS — K760 Fatty (change of) liver, not elsewhere classified: Secondary | ICD-10-CM | POA: Diagnosis not present

## 2021-12-26 DIAGNOSIS — E782 Mixed hyperlipidemia: Secondary | ICD-10-CM | POA: Diagnosis not present

## 2021-12-26 DIAGNOSIS — J209 Acute bronchitis, unspecified: Secondary | ICD-10-CM | POA: Diagnosis not present

## 2022-01-20 DIAGNOSIS — E119 Type 2 diabetes mellitus without complications: Secondary | ICD-10-CM | POA: Diagnosis not present

## 2022-02-07 DIAGNOSIS — Z23 Encounter for immunization: Secondary | ICD-10-CM | POA: Diagnosis not present

## 2022-07-21 DIAGNOSIS — Z1231 Encounter for screening mammogram for malignant neoplasm of breast: Secondary | ICD-10-CM | POA: Diagnosis not present

## 2022-07-24 DIAGNOSIS — E1169 Type 2 diabetes mellitus with other specified complication: Secondary | ICD-10-CM | POA: Diagnosis not present

## 2022-07-24 DIAGNOSIS — E782 Mixed hyperlipidemia: Secondary | ICD-10-CM | POA: Diagnosis not present

## 2022-07-24 DIAGNOSIS — R5383 Other fatigue: Secondary | ICD-10-CM | POA: Diagnosis not present

## 2022-07-24 DIAGNOSIS — M810 Age-related osteoporosis without current pathological fracture: Secondary | ICD-10-CM | POA: Diagnosis not present

## 2022-07-24 DIAGNOSIS — Z0000 Encounter for general adult medical examination without abnormal findings: Secondary | ICD-10-CM | POA: Diagnosis not present

## 2022-07-24 DIAGNOSIS — K219 Gastro-esophageal reflux disease without esophagitis: Secondary | ICD-10-CM | POA: Diagnosis not present

## 2022-07-24 DIAGNOSIS — M109 Gout, unspecified: Secondary | ICD-10-CM | POA: Diagnosis not present

## 2022-07-24 DIAGNOSIS — E119 Type 2 diabetes mellitus without complications: Secondary | ICD-10-CM | POA: Diagnosis not present

## 2022-07-24 DIAGNOSIS — I10 Essential (primary) hypertension: Secondary | ICD-10-CM | POA: Diagnosis not present

## 2022-08-08 DIAGNOSIS — M6281 Muscle weakness (generalized): Secondary | ICD-10-CM | POA: Diagnosis not present

## 2022-08-08 DIAGNOSIS — M25611 Stiffness of right shoulder, not elsewhere classified: Secondary | ICD-10-CM | POA: Diagnosis not present

## 2022-08-13 DIAGNOSIS — M8588 Other specified disorders of bone density and structure, other site: Secondary | ICD-10-CM | POA: Diagnosis not present

## 2022-08-22 DIAGNOSIS — M6281 Muscle weakness (generalized): Secondary | ICD-10-CM | POA: Diagnosis not present

## 2022-08-22 DIAGNOSIS — M25611 Stiffness of right shoulder, not elsewhere classified: Secondary | ICD-10-CM | POA: Diagnosis not present

## 2022-09-23 DIAGNOSIS — M25611 Stiffness of right shoulder, not elsewhere classified: Secondary | ICD-10-CM | POA: Diagnosis not present

## 2022-09-23 DIAGNOSIS — M6281 Muscle weakness (generalized): Secondary | ICD-10-CM | POA: Diagnosis not present

## 2022-10-10 DIAGNOSIS — M6281 Muscle weakness (generalized): Secondary | ICD-10-CM | POA: Diagnosis not present

## 2022-10-10 DIAGNOSIS — M25611 Stiffness of right shoulder, not elsewhere classified: Secondary | ICD-10-CM | POA: Diagnosis not present

## 2022-11-14 DIAGNOSIS — M25611 Stiffness of right shoulder, not elsewhere classified: Secondary | ICD-10-CM | POA: Diagnosis not present

## 2022-11-14 DIAGNOSIS — M6281 Muscle weakness (generalized): Secondary | ICD-10-CM | POA: Diagnosis not present

## 2022-12-02 DIAGNOSIS — H16223 Keratoconjunctivitis sicca, not specified as Sjogren's, bilateral: Secondary | ICD-10-CM | POA: Diagnosis not present

## 2022-12-02 DIAGNOSIS — H04213 Epiphora due to excess lacrimation, bilateral lacrimal glands: Secondary | ICD-10-CM | POA: Diagnosis not present

## 2023-01-02 DIAGNOSIS — H16223 Keratoconjunctivitis sicca, not specified as Sjogren's, bilateral: Secondary | ICD-10-CM | POA: Diagnosis not present

## 2023-01-15 DIAGNOSIS — E119 Type 2 diabetes mellitus without complications: Secondary | ICD-10-CM | POA: Diagnosis not present

## 2023-01-19 ENCOUNTER — Encounter: Payer: Self-pay | Admitting: Family Medicine

## 2023-01-19 LAB — HM DIABETES EYE EXAM

## 2023-01-28 DIAGNOSIS — Z23 Encounter for immunization: Secondary | ICD-10-CM | POA: Diagnosis not present

## 2023-01-28 DIAGNOSIS — M25561 Pain in right knee: Secondary | ICD-10-CM | POA: Diagnosis not present

## 2023-01-28 DIAGNOSIS — M25562 Pain in left knee: Secondary | ICD-10-CM | POA: Diagnosis not present

## 2023-01-28 DIAGNOSIS — G8929 Other chronic pain: Secondary | ICD-10-CM | POA: Diagnosis not present

## 2023-01-28 DIAGNOSIS — E782 Mixed hyperlipidemia: Secondary | ICD-10-CM | POA: Diagnosis not present

## 2023-01-28 DIAGNOSIS — E559 Vitamin D deficiency, unspecified: Secondary | ICD-10-CM | POA: Diagnosis not present

## 2023-01-28 DIAGNOSIS — R6881 Early satiety: Secondary | ICD-10-CM | POA: Diagnosis not present

## 2023-01-28 DIAGNOSIS — I10 Essential (primary) hypertension: Secondary | ICD-10-CM | POA: Diagnosis not present

## 2023-01-28 DIAGNOSIS — E119 Type 2 diabetes mellitus without complications: Secondary | ICD-10-CM | POA: Diagnosis not present

## 2023-01-28 DIAGNOSIS — M545 Low back pain, unspecified: Secondary | ICD-10-CM | POA: Diagnosis not present

## 2023-02-06 ENCOUNTER — Ambulatory Visit: Payer: 59 | Admitting: Orthopedic Surgery

## 2023-03-04 ENCOUNTER — Other Ambulatory Visit (INDEPENDENT_AMBULATORY_CARE_PROVIDER_SITE_OTHER): Payer: 59

## 2023-03-04 ENCOUNTER — Ambulatory Visit (INDEPENDENT_AMBULATORY_CARE_PROVIDER_SITE_OTHER): Payer: 59 | Admitting: Orthopedic Surgery

## 2023-03-04 VITALS — Ht 59.0 in | Wt 133.6 lb

## 2023-03-04 DIAGNOSIS — G8929 Other chronic pain: Secondary | ICD-10-CM

## 2023-03-04 DIAGNOSIS — M545 Low back pain, unspecified: Secondary | ICD-10-CM

## 2023-03-04 NOTE — Progress Notes (Signed)
Orthopedic Spine Surgery Office Note  Assessment: Patient is a 82 y.o. female with low back pain that radiates into the left lower extremity along the lateral aspect.  Has nearly resolved at this point   Plan: -Explained that initially conservative treatment is tried as a significant number of patients may experience relief with these treatment modalities. Discussed that the conservative treatments include:  -activity modification  -physical therapy  -over the counter pain medications  -medrol dosepak  -lumbar steroid injections -Patient has tried PT -Recommended Tylenol up to 1000 mg 3 times daily if pain returns.  Told her that she could also use Aleve in addition to Tylenol for particular bad pain but she should take it with plenty of food and fluids -Would need an updated A1c that is less than 7.5 before any elective spine surgery -Patient should return to office on an as-needed basis   Patient expressed understanding of the plan and all questions were answered to the patient's satisfaction.   ___________________________________________________________________________   History:  Patient is a 82 y.o. female who presents today for lumbar spine.  Patient was putting up Christmas decorations about a month ago.  Afterwards, she noted acute onset of low back pain that radiated into the left lower extremity.  She felt it along the lateral aspect of the thigh and leg.  Pain has gradually gotten better with time.  She has done some physical therapy.  She is not having any pain into the left leg anymore.  She is having some mild lower lumbar pain but she is not requiring any medication to control it.  She still does not have any pain rating into the right lower extremity.  There is no trauma or specific injury she recalls when she was putting of the Christmas decorations.   Weakness: Denies Symptoms of imbalance: Denies Paresthesias and numbness: Denies Bowel or bladder incontinence:  Denies Saddle anesthesia: Denies  Treatments tried: PT  Review of systems: Denies fevers and chills, night sweats, unexplained weight loss, history of cancer, pain that wakes them at night  Past medical history: DM (no recent A1c in the system) HLD HTN  Allergies: NKDA  Past surgical history:  Bilateral wrist fracture ORIF Appendectomy  Social history: Denies use of nicotine product (smoking, vaping, patches, smokeless) Alcohol use: Denies Denies recreational drug use   Physical Exam:  BMI of 27.0  General: no acute distress, appears stated age Neurologic: alert, answering questions appropriately, following commands Respiratory: unlabored breathing on room air, symmetric chest rise Psychiatric: appropriate affect, normal cadence to speech   MSK (spine):  -Strength exam      Left  Right EHL    5/5  5/5 TA    5/5  5/5 GSC    5/5  5/5 Knee extension  5/5  5/5 Hip flexion   5/5  5/5  -Sensory exam    Sensation intact to light touch in L3-S1 nerve distributions of bilateral lower extremities  -Achilles DTR: 2/4 on the left, 2/4 on the right -Patellar tendon DTR: 2/4 on the left, 2/4 on the right  -Straight leg raise: Negative bilaterally -Clonus: no beats bilaterally  -Left hip exam: No pain through range of motion -Right hip exam: No pain through range of motion  Imaging: XRs of the lumbar spine from 03/04/2023 was independently reviewed and interpreted, showing scoliosis curvature with apex to the left that measures 15 degrees.  Spondylolisthesis seen at L4/5.  Spondylolisthesis shifts about 2 mm between flexion and extension views.  Multiple  levels with disc height loss and anterior osteophyte formation.  No fracture or dislocation seen.   Patient name: Alexis Kaiser Patient MRN: [id number] Date of visit: 03/04/23

## 2023-07-16 DIAGNOSIS — N3281 Overactive bladder: Secondary | ICD-10-CM | POA: Diagnosis not present

## 2023-07-16 DIAGNOSIS — E119 Type 2 diabetes mellitus without complications: Secondary | ICD-10-CM | POA: Diagnosis not present

## 2023-07-16 DIAGNOSIS — E782 Mixed hyperlipidemia: Secondary | ICD-10-CM | POA: Diagnosis not present

## 2023-07-16 DIAGNOSIS — R5383 Other fatigue: Secondary | ICD-10-CM | POA: Diagnosis not present

## 2023-07-16 DIAGNOSIS — E559 Vitamin D deficiency, unspecified: Secondary | ICD-10-CM | POA: Diagnosis not present

## 2023-07-16 DIAGNOSIS — I10 Essential (primary) hypertension: Secondary | ICD-10-CM | POA: Diagnosis not present

## 2023-07-22 DIAGNOSIS — Z1231 Encounter for screening mammogram for malignant neoplasm of breast: Secondary | ICD-10-CM | POA: Diagnosis not present

## 2023-08-27 DIAGNOSIS — E119 Type 2 diabetes mellitus without complications: Secondary | ICD-10-CM | POA: Diagnosis not present

## 2023-08-27 DIAGNOSIS — Z0000 Encounter for general adult medical examination without abnormal findings: Secondary | ICD-10-CM | POA: Diagnosis not present

## 2023-08-27 DIAGNOSIS — K760 Fatty (change of) liver, not elsewhere classified: Secondary | ICD-10-CM | POA: Diagnosis not present

## 2023-08-27 DIAGNOSIS — E782 Mixed hyperlipidemia: Secondary | ICD-10-CM | POA: Diagnosis not present

## 2023-08-27 DIAGNOSIS — K219 Gastro-esophageal reflux disease without esophagitis: Secondary | ICD-10-CM | POA: Diagnosis not present

## 2023-08-27 DIAGNOSIS — I10 Essential (primary) hypertension: Secondary | ICD-10-CM | POA: Diagnosis not present

## 2023-08-27 DIAGNOSIS — E1169 Type 2 diabetes mellitus with other specified complication: Secondary | ICD-10-CM | POA: Diagnosis not present

## 2023-08-27 DIAGNOSIS — Z23 Encounter for immunization: Secondary | ICD-10-CM | POA: Diagnosis not present

## 2023-08-27 DIAGNOSIS — N3281 Overactive bladder: Secondary | ICD-10-CM | POA: Diagnosis not present

## 2023-08-27 DIAGNOSIS — M109 Gout, unspecified: Secondary | ICD-10-CM | POA: Diagnosis not present

## 2023-10-13 ENCOUNTER — Emergency Department (HOSPITAL_BASED_OUTPATIENT_CLINIC_OR_DEPARTMENT_OTHER)

## 2023-10-13 ENCOUNTER — Emergency Department (HOSPITAL_BASED_OUTPATIENT_CLINIC_OR_DEPARTMENT_OTHER)
Admission: EM | Admit: 2023-10-13 | Discharge: 2023-10-14 | Disposition: A | Discharge status: Home / Self Care | Attending: Emergency Medicine | Admitting: Emergency Medicine

## 2023-10-13 ENCOUNTER — Encounter (HOSPITAL_BASED_OUTPATIENT_CLINIC_OR_DEPARTMENT_OTHER): Payer: Self-pay | Admitting: Emergency Medicine

## 2023-10-13 ENCOUNTER — Other Ambulatory Visit: Payer: Self-pay

## 2023-10-13 DIAGNOSIS — K5900 Constipation, unspecified: Secondary | ICD-10-CM | POA: Diagnosis not present

## 2023-10-13 DIAGNOSIS — K6289 Other specified diseases of anus and rectum: Secondary | ICD-10-CM | POA: Diagnosis not present

## 2023-10-13 DIAGNOSIS — Z7982 Long term (current) use of aspirin: Secondary | ICD-10-CM | POA: Insufficient documentation

## 2023-10-13 DIAGNOSIS — N281 Cyst of kidney, acquired: Secondary | ICD-10-CM | POA: Diagnosis not present

## 2023-10-13 DIAGNOSIS — R1030 Lower abdominal pain, unspecified: Secondary | ICD-10-CM | POA: Diagnosis not present

## 2023-10-13 DIAGNOSIS — R109 Unspecified abdominal pain: Secondary | ICD-10-CM | POA: Diagnosis present

## 2023-10-13 LAB — URINALYSIS, ROUTINE W REFLEX MICROSCOPIC
Bilirubin Urine: NEGATIVE
Glucose, UA: NEGATIVE mg/dL
Hgb urine dipstick: NEGATIVE
Ketones, ur: NEGATIVE mg/dL
Leukocytes,Ua: NEGATIVE
Nitrite: NEGATIVE
Protein, ur: NEGATIVE mg/dL
Specific Gravity, Urine: 1.025 (ref 1.005–1.030)
pH: 5.5 (ref 5.0–8.0)

## 2023-10-13 LAB — COMPREHENSIVE METABOLIC PANEL WITH GFR
ALT: 70 U/L — ABNORMAL HIGH (ref 0–44)
AST: 89 U/L — ABNORMAL HIGH (ref 15–41)
Albumin: 4.3 g/dL (ref 3.5–5.0)
Alkaline Phosphatase: 57 U/L (ref 38–126)
Anion gap: 16 — ABNORMAL HIGH (ref 5–15)
BUN: 17 mg/dL (ref 8–23)
CO2: 17 mmol/L — ABNORMAL LOW (ref 22–32)
Calcium: 10.4 mg/dL — ABNORMAL HIGH (ref 8.9–10.3)
Chloride: 102 mmol/L (ref 98–111)
Creatinine, Ser: 0.9 mg/dL (ref 0.44–1.00)
GFR, Estimated: >60 mL/min (ref >60)
Glucose, Bld: 124 mg/dL — ABNORMAL HIGH (ref 70–99)
Potassium: 4.2 mmol/L (ref 3.5–5.1)
Sodium: 135 mmol/L (ref 135–145)
Total Bilirubin: 0.7 mg/dL (ref 0.0–1.2)
Total Protein: 7.5 g/dL (ref 6.5–8.1)

## 2023-10-13 LAB — CBC
HCT: 40.9 % (ref 36.0–46.0)
Hemoglobin: 14.2 g/dL (ref 12.0–15.0)
MCH: 32.6 pg (ref 26.0–34.0)
MCHC: 34.7 g/dL (ref 30.0–36.0)
MCV: 93.8 fL (ref 80.0–100.0)
Platelets: 165 K/uL (ref 150–400)
RBC: 4.36 MIL/uL (ref 3.87–5.11)
RDW: 12.4 % (ref 11.5–15.5)
WBC: 10.4 K/uL (ref 4.0–10.5)
nRBC: 0 % (ref 0.0–0.2)

## 2023-10-13 LAB — LIPASE, BLOOD: Lipase: 78 U/L — ABNORMAL HIGH (ref 11–51)

## 2023-10-13 MED ORDER — IOHEXOL 300 MG/ML  SOLN
80.0000 mL | Freq: Once | INTRAMUSCULAR | Status: AC | PRN
  Administered 2023-10-13: 80 mL via INTRAVENOUS

## 2023-10-13 NOTE — ED Notes (Signed)
 Patient reports not urinating today despite having the urge to. She has 405 in the bladder with bladder scan. She then urinated for the sample and a post void was done showing 280 mL. Provider made aware.

## 2023-10-13 NOTE — ED Triage Notes (Signed)
 Lower abdo pain Rectal pain Last BM 1 week ago denies n/v Difficulty urinating Took advil PTA little relief

## 2023-10-14 MED ORDER — DOCUSATE SODIUM 100 MG PO CAPS: 100.0000 mg | ORAL_CAPSULE | Freq: Every day | ORAL | 0 refills | Status: AC

## 2023-10-14 MED ORDER — POLYETHYLENE GLYCOL 3350 17 G PO PACK: 17.0000 g | PACK | Freq: Every day | ORAL | 0 refills | Status: AC | PRN

## 2023-10-14 NOTE — ED Notes (Signed)
 Patient feels great, she is getting dressed and is ready to go, will update vitals and inform the provider

## 2023-10-14 NOTE — ED Provider Notes (Signed)
 [place] [facility] DEPARTMENT AT [facility] Provider Note   CSN: [id number] Arrival date & time: 10/13/23  2108     Patient presents with: Abdominal Pain   Alexis Kaiser is a 83 y.o. female.   Patient presents to the emergency department for evaluation of abdominal pain.  Patient reports that symptoms began a week ago, progressively worsening.  Patient reports that she has having difficulty urinating.  She also has not had a bowel movement in 1 week.       Prior to Admission medications   Medication Sig Start Date End Date Taking? Authorizing Provider  docusate sodium  (COLACE) 100 MG capsule Take 1 capsule (100 mg total) by mouth daily. 10/14/23  Yes Wauneta Silveria, Lonni PARAS, MD  polyethylene glycol (MIRALAX  / GLYCOLAX ) 17 g packet Take 17 g by mouth daily as needed for moderate constipation. 10/14/23  Yes Eliu Batch, Lonni PARAS, MD  acetaminophen  (TYLENOL ) 500 MG tablet Take 2 tablets (1,000 mg total) by mouth every 8 (eight) hours as needed for moderate pain. 07/17/19   Darr, Jacob, PA-C  aspirin  81 MG tablet Take 81 mg by mouth daily.    [provider]  Cholecalciferol 4000 UNITS CAPS Take 4,000 Units by mouth daily.    [provider]  gabapentin  (NEURONTIN ) 300 MG capsule Take 300-600 mg by mouth daily.    [provider]  gabapentin  (NEURONTIN ) 300 MG capsule Take 600 mg by mouth at bedtime.    [provider]  losartan (COZAAR) 100 MG tablet Take 100 mg by mouth daily.    [provider]  metFORMIN (GLUMETZA) 500 MG (MOD) 24 hr tablet Take 500 mg by mouth 2 (two) times daily with a meal.    [provider]  rosuvastatin (CRESTOR) 10 MG tablet Take 10 mg by mouth daily. Dose unknown    [provider]  simvastatin  (ZOCOR ) 40 MG tablet Take 40 mg by mouth every evening.    [provider]  valsartan (DIOVAN) 160 MG tablet Take 160 mg by mouth daily.    [provider]    Allergies:  Patient has no known allergies.    Review of Systems  Updated Vital Signs BP (!) 162/103 (BP Location: Left Arm)   Pulse 75   Temp 98 F (36.7 C) (Oral)   Resp 18   SpO2 99%   Physical Exam Vitals and nursing note reviewed. Exam conducted with a chaperone present.  Constitutional:      General: She is not in acute distress.    Appearance: She is well-developed.  HENT:     Head: Normocephalic and atraumatic.     Mouth/Throat:     Mouth: Mucous membranes are moist.  Eyes:     General: Vision grossly intact. Gaze aligned appropriately.     Extraocular Movements: Extraocular movements intact.     Conjunctiva/sclera: Conjunctivae normal.  Cardiovascular:     Rate and Rhythm: Normal rate and regular rhythm.     Pulses: Normal pulses.     Heart sounds: Normal heart sounds, S1 normal and S2 normal. No murmur heard.    No friction rub. No gallop.  Pulmonary:     Effort: Pulmonary effort is normal. No respiratory distress.     Breath sounds: Normal breath sounds.  Abdominal:     General: Bowel sounds are normal.     Palpations: Abdomen is soft.     Tenderness: There is no abdominal tenderness. There is no guarding or rebound.  Hernia: No hernia is present.  Genitourinary:    Rectum: No mass, tenderness or anal fissure. Normal anal tone.     Comments: Soft claylike stool present in rectum Musculoskeletal:        General: No swelling.     Cervical back: Full passive range of motion without pain, normal range of motion and neck supple. No spinous process tenderness or muscular tenderness. Normal range of motion.     Right lower leg: No edema.     Left lower leg: No edema.  Skin:    General: Skin is warm and dry.     Capillary Refill: Capillary refill takes less than 2 seconds.     Findings: No ecchymosis, erythema, rash or wound.  Neurological:     General: No focal deficit present.     Mental Status: She is alert and oriented to person, place, and time.     GCS: GCS eye  subscore is 4. GCS verbal subscore is 5. GCS motor subscore is 6.     Cranial Nerves: Cranial nerves 2-12 are intact.     Sensory: Sensation is intact.     Motor: Motor function is intact.     Coordination: Coordination is intact.  Psychiatric:        Attention and Perception: Attention normal.        Mood and Affect: Mood normal.        Speech: Speech normal.        Behavior: Behavior normal.     (all labs ordered are listed, but only abnormal results are displayed) Labs Reviewed  LIPASE, BLOOD - Abnormal; Notable for the following components:      Result Value   Lipase 78 (*)    All other components within normal limits  COMPREHENSIVE METABOLIC PANEL WITH GFR - Abnormal; Notable for the following components:   CO2 17 (*)    Glucose, Bld 124 (*)    Calcium 10.4 (*)    AST 89 (*)    ALT 70 (*)    Anion gap 16 (*)    All other components within normal limits  CBC  URINALYSIS, ROUTINE W REFLEX MICROSCOPIC    EKG: None  Radiology: CT ABDOMEN PELVIS W CONTRAST Result Date: 10/13/2023 EXAM: CT ABDOMEN AND PELVIS WITH CONTRAST 10/13/2023 10:58:53 PM TECHNIQUE: CT of the abdomen and pelvis was performed with the administration of intravenous contrast. Multiplanar reformatted images are provided for review. Automated exposure control, iterative reconstruction, and/or weight based adjustment of the mA/kV was utilized to reduce the radiation dose to as low as reasonably achievable. COMPARISON: None available. CLINICAL HISTORY: Abdominal pain, acute, nonlocalized; Lower abdominal pain; Rectal pain; Last bowel movement 1 week ago, denies nausea/vomiting; Difficulty urinating; Took Advil prior to arrival with little relief. FINDINGS: LOWER CHEST: No acute abnormality. LIVER: The liver is unremarkable. GALLBLADDER AND BILE DUCTS: Gallbladder is unremarkable. No biliary ductal dilatation. SPLEEN: No acute abnormality. PANCREAS: No acute abnormality. ADRENAL GLANDS: No acute abnormality. KIDNEYS,  URETERS AND BLADDER: No stones in the kidneys or ureters. No hydronephrosis. No perinephric or periureteral stranding. Urinary bladder is unremarkable. Bilateral renal cysts, measuring up to 3.9 cm in the left upper kidney (image 27), benign (Bosniak 1). Per consensus, no follow-up is needed for simple Bosniak type 1 and 2 renal cysts, unless the patient has a malignancy history or risk factors. GI AND BOWEL: Stomach demonstrates no acute abnormality. There is no bowel obstruction. No bowel wall thickening. Mild rectal stool burden, suggesting mild rectal  fecal impaction. PERITONEUM AND RETROPERITONEUM: No ascites. No free air. VASCULATURE: Atherosclerotic calcifications of the abdominal aorta and branch vessels, although patent. LYMPH NODES: No lymphadenopathy. REPRODUCTIVE ORGANS: Uterus is within normal limits. BONES AND SOFT TISSUES: Moderate degenerative changes of the visualized thoracolumbar spine. No acute osseous abnormality. No focal soft tissue abnormality. IMPRESSION: 1. Mild rectal stool burden, suggesting mild rectal fecal impaction. Electronically signed by: Pinkie Pebbles MD 10/13/2023 11:07 PM EDT RP Workstation: [id number]     Procedures   Medications Ordered in the ED  iohexol  (OMNIPAQUE ) 300 MG/ML solution 80 mL (80 mLs Intravenous Contrast Given 10/13/23 2239)                                    Medical Decision Making Amount and/or Complexity of Data Reviewed Labs: ordered.   Differential Diagnosis considered includes, but not limited to: Appendicitis; colitis; diverticulitis; bowel obstruction; hernia; cystitis; nephrolithiasis; pyelonephritis.  Presents to the emergency department for evaluation of abdominal pain.  Patient complaining of decreased urine output as well as decreased bowel movements for 1 week.  Patient did spontaneously void here in the ED.  Suspect some of her decreased urine output is secondary to her constipation.  CT scan shows a large stool burden with  stool in the rectum, no obstruction, no other abnormality.   Patient administered soapsuds enema with large bowel movement and significant improvement of her symptoms.       Final diagnoses:  Constipation, unspecified constipation type    ED Discharge Orders          Ordered    docusate sodium  (COLACE) 100 MG capsule  Daily        10/14/23 0129    polyethylene glycol (MIRALAX  / GLYCOLAX ) 17 g packet  Daily PRN        10/14/23 0129               Sylvio Weatherall J, MD 10/14/23 [phone]

## 2023-10-14 NOTE — ED Notes (Signed)
 Patient reports feeling much better after having a large bowel movement. She wanted one more run of the enema, another 150 mL was given.

## 2023-10-14 NOTE — ED Notes (Addendum)
 Patient has small success with enema, wanted to try to take more. An additional 150 mL was given. She reports some small amount of blood, some hemorrhoids are noted to the rectum. Provider made aware.

## 2023-10-20 DIAGNOSIS — K5900 Constipation, unspecified: Secondary | ICD-10-CM | POA: Diagnosis not present

## 2024-01-18 DIAGNOSIS — E119 Type 2 diabetes mellitus without complications: Secondary | ICD-10-CM | POA: Diagnosis not present
